# Patient Record
Sex: Female | Born: 1950 | Race: Black or African American | Hispanic: No | Marital: Single | Smoking: Former smoker
Health system: Southern US, Community
[De-identification: ages and names within clinical notes are randomized; demographics above are authoritative.]

## PROBLEM LIST (undated history)

## (undated) DIAGNOSIS — N200 Calculus of kidney: Secondary | ICD-10-CM

## (undated) DIAGNOSIS — R945 Abnormal results of liver function studies: Secondary | ICD-10-CM

## (undated) DIAGNOSIS — K8020 Calculus of gallbladder without cholecystitis without obstruction: Secondary | ICD-10-CM

## (undated) DIAGNOSIS — K602 Anal fissure, unspecified: Secondary | ICD-10-CM

## (undated) DIAGNOSIS — R252 Cramp and spasm: Secondary | ICD-10-CM

## (undated) DIAGNOSIS — M549 Dorsalgia, unspecified: Secondary | ICD-10-CM

## (undated) DIAGNOSIS — M810 Age-related osteoporosis without current pathological fracture: Secondary | ICD-10-CM

## (undated) DIAGNOSIS — D649 Anemia, unspecified: Secondary | ICD-10-CM

## (undated) DIAGNOSIS — K589 Irritable bowel syndrome without diarrhea: Secondary | ICD-10-CM

## (undated) DIAGNOSIS — T7840XA Allergy, unspecified, initial encounter: Secondary | ICD-10-CM

## (undated) DIAGNOSIS — IMO0002 Reserved for concepts with insufficient information to code with codable children: Secondary | ICD-10-CM

## (undated) DIAGNOSIS — F411 Generalized anxiety disorder: Secondary | ICD-10-CM

## (undated) DIAGNOSIS — Z87442 Personal history of urinary calculi: Secondary | ICD-10-CM

## (undated) DIAGNOSIS — M1990 Unspecified osteoarthritis, unspecified site: Secondary | ICD-10-CM

## (undated) DIAGNOSIS — K219 Gastro-esophageal reflux disease without esophagitis: Secondary | ICD-10-CM

## (undated) DIAGNOSIS — E876 Hypokalemia: Secondary | ICD-10-CM

## (undated) DIAGNOSIS — R319 Hematuria, unspecified: Secondary | ICD-10-CM

## (undated) DIAGNOSIS — I10 Essential (primary) hypertension: Secondary | ICD-10-CM

## (undated) DIAGNOSIS — D126 Benign neoplasm of colon, unspecified: Secondary | ICD-10-CM

## (undated) DIAGNOSIS — R109 Unspecified abdominal pain: Secondary | ICD-10-CM

## (undated) DIAGNOSIS — J309 Allergic rhinitis, unspecified: Secondary | ICD-10-CM

## (undated) DIAGNOSIS — Z86010 Personal history of colonic polyps: Secondary | ICD-10-CM

## (undated) DIAGNOSIS — Z0000 Encounter for general adult medical examination without abnormal findings: Secondary | ICD-10-CM

## (undated) HISTORY — DX: Gastro-esophageal reflux disease without esophagitis: K21.9

## (undated) HISTORY — DX: Calculus of gallbladder without cholecystitis without obstruction: K80.20

## (undated) HISTORY — DX: Generalized anxiety disorder: F41.1

## (undated) HISTORY — DX: Anemia, unspecified: D64.9

## (undated) HISTORY — DX: Essential (primary) hypertension: I10

## (undated) HISTORY — DX: Irritable bowel syndrome without diarrhea: K58.9

## (undated) HISTORY — PX: POLYPECTOMY: SHX149

## (undated) HISTORY — DX: Reserved for concepts with insufficient information to code with codable children: IMO0002

## (undated) HISTORY — DX: Unspecified abdominal pain: R10.9

## (undated) HISTORY — DX: Age-related osteoporosis without current pathological fracture: M81.0

## (undated) HISTORY — DX: Cramp and spasm: R25.2

## (undated) HISTORY — DX: Personal history of urinary calculi: Z87.442

## (undated) HISTORY — DX: Allergy, unspecified, initial encounter: T78.40XA

## (undated) HISTORY — DX: Hypokalemia: E87.6

## (undated) HISTORY — DX: Dorsalgia, unspecified: M54.9

## (undated) HISTORY — DX: Calculus of kidney: N20.0

## (undated) HISTORY — DX: Personal history of colonic polyps: Z86.010

## (undated) HISTORY — PX: COLONOSCOPY: SHX174

## (undated) HISTORY — DX: Benign neoplasm of colon, unspecified: D12.6

## (undated) HISTORY — DX: Abnormal results of liver function studies: R94.5

## (undated) HISTORY — DX: Hematuria, unspecified: R31.9

## (undated) HISTORY — PX: CHOLECYSTECTOMY: SHX55

## (undated) HISTORY — DX: Encounter for general adult medical examination without abnormal findings: Z00.00

## (undated) HISTORY — DX: Allergic rhinitis, unspecified: J30.9

---

## 1983-11-22 HISTORY — PX: OTHER SURGICAL HISTORY: SHX169

## 1985-11-21 HISTORY — PX: OTHER SURGICAL HISTORY: SHX169

## 2005-03-31 ENCOUNTER — Ambulatory Visit: Payer: Self-pay | Admitting: Internal Medicine

## 2005-04-28 ENCOUNTER — Ambulatory Visit: Payer: Self-pay

## 2005-09-19 ENCOUNTER — Ambulatory Visit: Payer: Self-pay | Admitting: Internal Medicine

## 2008-02-08 ENCOUNTER — Encounter: Payer: Self-pay | Admitting: Internal Medicine

## 2008-03-18 ENCOUNTER — Ambulatory Visit: Payer: Self-pay | Admitting: Internal Medicine

## 2008-03-18 DIAGNOSIS — Z860100 Personal history of colon polyps, unspecified: Secondary | ICD-10-CM | POA: Insufficient documentation

## 2008-03-18 DIAGNOSIS — E876 Hypokalemia: Secondary | ICD-10-CM

## 2008-03-18 DIAGNOSIS — K219 Gastro-esophageal reflux disease without esophagitis: Secondary | ICD-10-CM

## 2008-03-18 DIAGNOSIS — J309 Allergic rhinitis, unspecified: Secondary | ICD-10-CM | POA: Insufficient documentation

## 2008-03-18 DIAGNOSIS — F411 Generalized anxiety disorder: Secondary | ICD-10-CM | POA: Insufficient documentation

## 2008-03-18 DIAGNOSIS — R945 Abnormal results of liver function studies: Secondary | ICD-10-CM | POA: Insufficient documentation

## 2008-03-18 DIAGNOSIS — K8020 Calculus of gallbladder without cholecystitis without obstruction: Secondary | ICD-10-CM

## 2008-03-18 DIAGNOSIS — I10 Essential (primary) hypertension: Secondary | ICD-10-CM

## 2008-03-18 HISTORY — DX: Calculus of gallbladder without cholecystitis without obstruction: K80.20

## 2008-03-18 HISTORY — DX: Abnormal results of liver function studies: R94.5

## 2008-03-18 HISTORY — DX: Gastro-esophageal reflux disease without esophagitis: K21.9

## 2008-03-18 HISTORY — DX: Hypokalemia: E87.6

## 2008-03-18 HISTORY — DX: Essential (primary) hypertension: I10

## 2008-03-18 HISTORY — DX: Personal history of colonic polyps: Z86.010

## 2008-03-18 HISTORY — DX: Generalized anxiety disorder: F41.1

## 2008-03-18 HISTORY — DX: Allergic rhinitis, unspecified: J30.9

## 2008-03-19 LAB — CONVERTED CEMR LAB
Alkaline Phosphatase: 88 units/L (ref 39–117)
Bilirubin, Direct: 0.1 mg/dL (ref 0.0–0.3)
Total Bilirubin: 0.6 mg/dL (ref 0.3–1.2)
Total Protein: 7.9 g/dL (ref 6.0–8.3)

## 2008-08-23 ENCOUNTER — Emergency Department (HOSPITAL_BASED_OUTPATIENT_CLINIC_OR_DEPARTMENT_OTHER): Admission: EM | Admit: 2008-08-23 | Discharge: 2008-08-23 | Payer: Self-pay | Admitting: Emergency Medicine

## 2008-09-09 ENCOUNTER — Ambulatory Visit: Payer: Self-pay | Admitting: Internal Medicine

## 2008-09-09 DIAGNOSIS — IMO0002 Reserved for concepts with insufficient information to code with codable children: Secondary | ICD-10-CM

## 2008-09-09 HISTORY — DX: Reserved for concepts with insufficient information to code with codable children: IMO0002

## 2009-01-01 ENCOUNTER — Ambulatory Visit: Payer: Self-pay | Admitting: Internal Medicine

## 2009-01-01 DIAGNOSIS — K589 Irritable bowel syndrome without diarrhea: Secondary | ICD-10-CM

## 2009-01-01 DIAGNOSIS — M549 Dorsalgia, unspecified: Secondary | ICD-10-CM

## 2009-01-01 HISTORY — DX: Irritable bowel syndrome without diarrhea: K58.9

## 2009-01-01 HISTORY — DX: Dorsalgia, unspecified: M54.9

## 2009-01-12 ENCOUNTER — Telehealth (INDEPENDENT_AMBULATORY_CARE_PROVIDER_SITE_OTHER): Payer: Self-pay | Admitting: *Deleted

## 2009-01-26 ENCOUNTER — Encounter: Admission: RE | Admit: 2009-01-26 | Discharge: 2009-01-26 | Payer: Self-pay | Admitting: Internal Medicine

## 2009-01-27 ENCOUNTER — Encounter: Payer: Self-pay | Admitting: Internal Medicine

## 2009-08-03 ENCOUNTER — Telehealth: Payer: Self-pay | Admitting: Internal Medicine

## 2009-10-30 ENCOUNTER — Telehealth: Payer: Self-pay | Admitting: Internal Medicine

## 2009-10-30 ENCOUNTER — Encounter: Payer: Self-pay | Admitting: Internal Medicine

## 2010-01-25 ENCOUNTER — Telehealth (INDEPENDENT_AMBULATORY_CARE_PROVIDER_SITE_OTHER): Payer: Self-pay | Admitting: *Deleted

## 2010-03-01 ENCOUNTER — Ambulatory Visit: Payer: Self-pay | Admitting: Internal Medicine

## 2010-03-01 DIAGNOSIS — R252 Cramp and spasm: Secondary | ICD-10-CM

## 2010-03-01 DIAGNOSIS — R109 Unspecified abdominal pain: Secondary | ICD-10-CM

## 2010-03-01 DIAGNOSIS — Z87442 Personal history of urinary calculi: Secondary | ICD-10-CM

## 2010-03-01 DIAGNOSIS — R319 Hematuria, unspecified: Secondary | ICD-10-CM

## 2010-03-01 HISTORY — DX: Personal history of urinary calculi: Z87.442

## 2010-03-01 HISTORY — DX: Hematuria, unspecified: R31.9

## 2010-03-01 HISTORY — DX: Unspecified abdominal pain: R10.9

## 2010-03-01 HISTORY — DX: Cramp and spasm: R25.2

## 2010-03-01 LAB — CONVERTED CEMR LAB
Alkaline Phosphatase: 74 units/L (ref 39–117)
Basophils Absolute: 0.1 10*3/uL (ref 0.0–0.1)
Basophils Relative: 1.1 % (ref 0.0–3.0)
Bilirubin Urine: NEGATIVE
Creatinine, Ser: 0.7 mg/dL (ref 0.4–1.2)
Eosinophils Absolute: 0.3 10*3/uL (ref 0.0–0.7)
Glucose, Bld: 96 mg/dL (ref 70–99)
Glucose, Urine, Semiquant: NEGATIVE
HCT: 38.5 % (ref 36.0–46.0)
Hemoglobin: 13.1 g/dL (ref 12.0–15.0)
Ketones, ur: NEGATIVE mg/dL
Lipase: 49 units/L (ref 11.0–59.0)
Lymphs Abs: 2.4 10*3/uL (ref 0.7–4.0)
MCV: 85.3 fL (ref 78.0–100.0)
Magnesium: 2.2 mg/dL (ref 1.5–2.5)
Monocytes Absolute: 0.5 10*3/uL (ref 0.1–1.0)
Neutro Abs: 2.4 10*3/uL (ref 1.4–7.7)
Nitrite: NEGATIVE
Platelets: 239 10*3/uL (ref 150.0–400.0)
Protein, U semiquant: NEGATIVE
RBC: 4.52 M/uL (ref 3.87–5.11)
Specific Gravity, Urine: 1.01 (ref 1.000–1.030)
Total Protein, Urine: 30 mg/dL
Total Protein: 7.9 g/dL (ref 6.0–8.3)
Urine Glucose: NEGATIVE mg/dL
Urobilinogen, UA: 0.2 (ref 0.0–1.0)
WBC: 5.8 10*3/uL (ref 4.5–10.5)
pH: 7 (ref 5.0–8.0)

## 2010-03-02 ENCOUNTER — Telehealth: Payer: Self-pay | Admitting: Internal Medicine

## 2010-03-02 ENCOUNTER — Encounter: Payer: Self-pay | Admitting: Internal Medicine

## 2010-03-02 ENCOUNTER — Ambulatory Visit: Payer: Self-pay | Admitting: Cardiovascular Disease

## 2010-03-02 DIAGNOSIS — N200 Calculus of kidney: Secondary | ICD-10-CM | POA: Insufficient documentation

## 2010-03-02 HISTORY — DX: Calculus of kidney: N20.0

## 2010-03-03 ENCOUNTER — Encounter: Payer: Self-pay | Admitting: Internal Medicine

## 2010-03-05 ENCOUNTER — Encounter: Payer: Self-pay | Admitting: Internal Medicine

## 2010-03-08 ENCOUNTER — Ambulatory Visit (HOSPITAL_COMMUNITY): Admission: RE | Admit: 2010-03-08 | Discharge: 2010-03-08 | Payer: Self-pay | Admitting: Urology

## 2010-03-22 ENCOUNTER — Telehealth (INDEPENDENT_AMBULATORY_CARE_PROVIDER_SITE_OTHER): Payer: Self-pay | Admitting: *Deleted

## 2010-03-23 ENCOUNTER — Encounter: Payer: Self-pay | Admitting: Internal Medicine

## 2010-05-25 ENCOUNTER — Encounter: Payer: Self-pay | Admitting: Internal Medicine

## 2010-12-21 NOTE — Progress Notes (Signed)
Summary: REFERRAL  Phone Note Call from Patient Call back at Home Phone [phone] Call back at [facility] VM ON HM #   Summary of Call: Patient is requesting a call regarding CT scan. She was told after CT that Dr Jonny Ruiz was going to refer her to urology. Please call at cell # above.  Initial call taken by: Lamar Sprinkles, CMA,  March 02, 2010 2:19 PM  Follow-up for Phone Call        refer urology - order done Follow-up by: Corwin Levins MD,  March 02, 2010 2:22 PM

## 2010-12-21 NOTE — Letter (Signed)
[facility]: [facility]   Imported By: Sherian Rein 05/27/2010 13:59:13  _____________________________________________________________________  External Attachment:    Type:   Image     Comment:   External Document

## 2010-12-21 NOTE — Assessment & Plan Note (Signed)
Summary: blood in urine-lb   Vital Signs:  Patient profile:   60 year old female Height:      64.5 inches Weight:      169.75 pounds BMI:     28.79 O2 Sat:      97 % on Room air Temp:     98.6 degrees F oral Pulse rate:   67 / minute BP sitting:   132 / 90  (left arm) Cuff size:   regular  Vitals Entered ByZella Ball Ewing (March 01, 2010 4:42 PM)  O2 Flow:  Room air CC: Blood in urine/RE   CC:  Blood in urine/RE.  History of Present Illness: here with onset x 2 days gross hematuria, assoc with lower mid abd and right sided abd pain that seems to start in the right flank area, but little tenderness;  has had also freq and urgency but not clear if actual dysuria, and no frank fever, chills;'  has had some nausea, but no vomiting and has been trying to increased by mouth fluids at home.  Has hx of renal stone 2009 without compication, and other approx 25 yrs ago. No other bowel habit or GU symptoms.  Overall good complacine with meds, and good tolerance.  Pt denies CP, sob, doe, wheezing, orthopnea, pnd, worsening LE edema, palps, dizziness or syncope   Pt denies new neuro symptoms such as headache, facial or extremity weakness   Also incidently with bilat thigh cramps, usually at night , without unusual excercise, or claudication type pain, or diuretic.    Problems Prior to Update: 1)  Leg Cramps  (ICD-729.82) 2)  Hematuria Unspecified  (ICD-599.70) 3)  Flank Pain, Right  (ICD-789.09) 4)  Nephrolithiasis, Hx of  (ICD-V13.01) 5)  Irritable Bowel Syndrome  (ICD-564.1) 6)  Back Pain  (ICD-724.5) 7)  Lumbar Radiculopathy, Left  (ICD-724.4) 8)  Colonic Polyps, Hx of  (ICD-V12.72) 9)  Gerd  (ICD-530.81) 10)  Allergic Rhinitis  (ICD-477.9) 11)  Anxiety  (ICD-300.00) 12)  Hypertension  (ICD-401.9) 13)  Hypokalemia  (ICD-276.8) 14)  Liver Function Tests, Abnormal  (ICD-794.8) 15)  Cholelithiasis  (ICD-574.20)  Medications Prior to Update: 1)  Benazepril Hcl 20 Mg  Tabs (Benazepril  Hcl) .Marland Kitchen.. 1po Qd 2)  Adult Aspirin Ec Low Strength 81 Mg Tbec (Aspirin) .Marland Kitchen.. 1 By Mouth Once Daily 3)  Hydrocodone-Acetaminophen 5-325 Mg Tabs (Hydrocodone-Acetaminophen) .Marland Kitchen.. 1 By Mouth Q 6 Hrs As Needed Pain 4)  Diclofenac Sodium 75 Mg Tbec (Diclofenac Sodium) .Marland Kitchen.. 1 By Mouth Two Times A Day As Needed Pain  Current Medications (verified): 1)  Benazepril Hcl 20 Mg  Tabs (Benazepril Hcl) .Marland Kitchen.. 1po Qd 2)  Adult Aspirin Ec Low Strength 81 Mg Tbec (Aspirin) .Marland Kitchen.. 1 By Mouth Once Daily 3)  Hydrocodone-Acetaminophen 5-325 Mg Tabs (Hydrocodone-Acetaminophen) .Marland Kitchen.. 1 By Mouth Q 6 Hrs As Needed Pain 4)  Diclofenac Sodium 75 Mg Tbec (Diclofenac Sodium) .Marland Kitchen.. 1 By Mouth Two Times A Day As Needed Pain 5)  Ciprofloxacin Hcl 500 Mg Tabs (Ciprofloxacin Hcl) .Marland Kitchen.. 1po Two Times A Day  Allergies (verified): No Known Drug Allergies  Past History:  Past Surgical History: Last updated: 03/18/2008 Cholecystectomy cyst removed from left breast/1987 fibroid tumor 1985  Family History: Last updated: 03/18/2008 arthritis breast cancer lung cancer heart disease HTN brother with DM  Social History: Last updated: 03/18/2008 Former Smoker Alcohol use-yes  Risk Factors: Smoking Status: quit (03/18/2008)  Past Medical History: Cholelithiasis Hypertension Anxiety IBS Allergic rhinitis GERD Colonic polyps, hx of IBS Nephrolithiasis,  hx of - 2009, and 1985  Review of Systems       all otherwise negative per pt -    Physical Exam  General:  alert and overweight-appearing.   Head:  normocephalic and atraumatic.   Eyes:  vision grossly intact, pupils equal, and pupils round.   Ears:  R ear normal and L ear normal.   Nose:  no external deformity and no nasal discharge.   Mouth:  no gingival abnormalities and pharynx pink and moist.   Neck:  supple and no masses.   Lungs:  normal respiratory effort and normal breath sounds.   Heart:  normal rate and regular rhythm.   Abdomen:  soft and normal  bowel sounds with low mid abd and right tender, mild without guarding or rebound Msk:  no joint tenderness and no joint swelling.   Extremities:  no edema, no erythema    Impression & Recommendations:  Problem # 1:  FLANK PAIN, RIGHT (ICD-789.09)  Her updated medication list for this problem includes:    Adult Aspirin Ec Low Strength 81 Mg Tbec (Aspirin) .Marland Kitchen... 1 by mouth once daily    Hydrocodone-acetaminophen 5-325 Mg Tabs (Hydrocodone-acetaminophen) .Marland Kitchen... 1 by mouth q 6 hrs as needed pain    Diclofenac Sodium 75 Mg Tbec (Diclofenac sodium) .Marland Kitchen... 1 by mouth two times a day as needed pain for CT after labs tonight, prob renal stone, need to r/o obstruction  Orders: Radiology Referral (Radiology)  Problem # 2:  HEMATURIA UNSPECIFIED (ICD-599.70)  Her updated medication list for this problem includes:    Ciprofloxacin Hcl 500 Mg Tabs (Ciprofloxacin hcl) .Marland Kitchen... 1po two times a day prob uti vs stone ; for labs and urine studies, as well as CT above  Orders: T-Culture, Urine ([id number]) Radiology Referral (Radiology) TLB-CBC Platelet - w/Differential (85025-CBCD) TLB-Udip w/ Micro (81001-URINE) TLB-BMP (Basic Metabolic Panel-BMET) (80048-METABOL) TLB-Hepatic/Liver Function Pnl (80076-HEPATIC) TLB-Lipase (83690-LIPASE)  Problem # 3:  LEG CRAMPS (ICD-729.82) for labs above, as well as mg;  to try bcomplex vitiamin OTC as well Orders: TLB-Magnesium (Mg) (83735-MG)  Problem # 4:  HYPERTENSION (ICD-401.9)  Her updated medication list for this problem includes:    Benazepril Hcl 20 Mg Tabs (Benazepril hcl) .Marland Kitchen... 1po qd  BP today: 132/90 Prior BP: 112/80 (01/01/2009) stable overall by hx and exam, ok to continue meds/tx as is   Complete Medication List: 1)  Benazepril Hcl 20 Mg Tabs (Benazepril hcl) .Marland Kitchen.. 1po qd 2)  Adult Aspirin Ec Low Strength 81 Mg Tbec (Aspirin) .Marland Kitchen.. 1 by mouth once daily 3)  Hydrocodone-acetaminophen 5-325 Mg Tabs (Hydrocodone-acetaminophen) .Marland Kitchen.. 1 by mouth  q 6 hrs as needed pain 4)  Diclofenac Sodium 75 Mg Tbec (Diclofenac sodium) .Marland Kitchen.. 1 by mouth two times a day as needed pain 5)  Ciprofloxacin Hcl 500 Mg Tabs (Ciprofloxacin hcl) .Marland Kitchen.. 1po two times a day  Patient Instructions: 1)  Please go to the Lab in the basement for your blood and/or urine tests today  2)  Please take all new medications as prescribed 3)  You will be contacted about the referral(s) to: CT scan for tomorrow 4)  you should try B  complex vitamin OTC for the leg cramps 5)  Please schedule a follow-up appointment in 6 months with CPX labs Prescriptions: CIPROFLOXACIN HCL 500 MG TABS (CIPROFLOXACIN HCL) 1po two times a day  #20 x 0   Entered and Authorized by:   Corwin Levins MD   Signed by:   Corwin Levins MD  on 03/01/2010   Method used:   Print then Give to Patient   RxID:   [id number] HYDROCODONE-ACETAMINOPHEN 5-325 MG TABS (HYDROCODONE-ACETAMINOPHEN) 1 by mouth q 6 hrs as needed pain  #60 x 2   Entered and Authorized by:   Corwin Levins MD   Signed by:   Corwin Levins MD on 03/01/2010   Method used:   Print then Give to Patient   RxID:   [id number]   Laboratory Results   Urine Tests    Routine Urinalysis   Color: yellow Appearance: Clear Glucose: negative   (Normal Range: Negative) Bilirubin: small   (Normal Range: Negative) Ketone: negative   (Normal Range: Negative) Spec. Gravity: 1.010   (Normal Range: 1.003-1.035) Blood: large   (Normal Range: Negative) pH: 5.0   (Normal Range: 5.0-8.0) Protein: negative   (Normal Range: Negative) Urobilinogen: 0.2   (Normal Range: 0-1) Nitrite: negative   (Normal Range: Negative) Leukocyte Esterace: trace   (Normal Range: Negative)

## 2010-12-21 NOTE — Letter (Signed)
[facility]   Imported By: Sherian Rein 03/10/2010 09:05:47  _____________________________________________________________________  External Attachment:    Type:   Image     Comment:   External Document

## 2010-12-21 NOTE — Letter (Signed)
Summary: [facility]   Imported By: Sherian Rein 03/09/2010 08:29:09  _____________________________________________________________________  External Attachment:    Type:   Image     Comment:   External Document

## 2010-12-21 NOTE — Progress Notes (Signed)
Summary: Records request from [facility]  Request for records received from [facility]. Request forwarded to [employer]. Dena Chavis  January 25, 2010 3:26 PM

## 2010-12-21 NOTE — Progress Notes (Signed)
  Request recieved from [facility] sent to [facility]  Mar 22, 2010 9:33 AM

## 2010-12-21 NOTE — Letter (Signed)
Summary: [facility]   Imported By: Sherian Rein 03/29/2010 12:22:17  _____________________________________________________________________  External Attachment:    Type:   Image     Comment:   External Document

## 2010-12-21 NOTE — Miscellaneous (Signed)
Summary: Orders Update  Clinical Lists Changes  Problems: Added new problem of RENAL CALCULUS, RIGHT (ICD-592.0) Orders: Added new Referral order of Urology Referral (Urology) - Signed

## 2011-03-21 ENCOUNTER — Other Ambulatory Visit: Payer: Self-pay | Admitting: Internal Medicine

## 2011-04-03 ENCOUNTER — Encounter: Payer: Self-pay | Admitting: Internal Medicine

## 2011-04-03 DIAGNOSIS — Z0000 Encounter for general adult medical examination without abnormal findings: Secondary | ICD-10-CM

## 2011-04-03 HISTORY — DX: Encounter for general adult medical examination without abnormal findings: Z00.00

## 2011-04-06 ENCOUNTER — Encounter: Payer: Self-pay | Admitting: Internal Medicine

## 2011-04-06 ENCOUNTER — Ambulatory Visit (INDEPENDENT_AMBULATORY_CARE_PROVIDER_SITE_OTHER): Payer: Self-pay | Admitting: Internal Medicine

## 2011-04-06 VITALS — BP 130/82 | HR 69 | Temp 98.6°F | Ht 64.0 in | Wt 164.4 lb

## 2011-04-06 DIAGNOSIS — M25559 Pain in unspecified hip: Secondary | ICD-10-CM

## 2011-04-06 DIAGNOSIS — I10 Essential (primary) hypertension: Secondary | ICD-10-CM

## 2011-04-06 DIAGNOSIS — M25551 Pain in right hip: Secondary | ICD-10-CM

## 2011-04-06 DIAGNOSIS — M25552 Pain in left hip: Secondary | ICD-10-CM

## 2011-04-06 MED ORDER — BENAZEPRIL HCL 20 MG PO TABS: 20.0000 mg | ORAL_TABLET | Freq: Every day | ORAL | Status: DC

## 2011-04-06 MED ORDER — TRAMADOL-ACETAMINOPHEN 37.5-325 MG PO TABS: 1.0000 | ORAL_TABLET | Freq: Four times a day (QID) | ORAL | Status: AC | PRN

## 2011-04-06 NOTE — Patient Instructions (Signed)
Take all new medications as prescribed - the new pain medication Continue all other medications as before Please go to LAB in the Basement for the blood and/or urine tests to be done at your convenience Please call the phone number [phone] (the [employer]Tree System) for results of testing in 2-3 days;  When calling, simply dial the number, and when prompted enter the MRN number above (the Medical Record Number) and the # key, then the message should start. Please return in 1 year for your yearly visit, or sooner if needed

## 2011-04-06 NOTE — Progress Notes (Signed)
Subjective:    Patient ID: Robin Obrien, female    DOB: 25-Feb-1951, 60 y.o.   MRN: [id number]  HPI  Here to f/u; unfortunately no insurance at this time, but has mild hearing loss on the right - ? Wax, and needs med refills.  Bilat pain to the hips and stiffness gradually worse over the past yr, worse to ambulate but no falls;  Cannot afford ortho, and does not want to take the norco very much due to wariness of side effects.  Pt denies chest pain, increased sob or doe, wheezing, orthopnea, PND, increased LE swelling, palpitations, dizziness or syncope.  Pt denies new neurological symptoms such as new headache, or facial or extremity weakness or numbness   Pt denies polydipsia, polyuria.   Pt denies fever, wt loss, night sweats, loss of appetite, or other constitutional symptoms  Overall good compliance with treatment, and good medicine tolerability except for the above.  Denies worsening depressive symptoms, suicidal ideation, or panic, though has ongoing anxiety, not increased recently.  Past Medical History  Diagnosis Date  . HYPOKALEMIA 03/18/2008  . ANXIETY 03/18/2008  . HYPERTENSION 03/18/2008  . ALLERGIC RHINITIS 03/18/2008  . GERD 03/18/2008  . Irritable bowel syndrome 01/01/2009  . CHOLELITHIASIS 03/18/2008  . RENAL CALCULUS, RIGHT 03/02/2010  . HEMATURIA UNSPECIFIED 03/01/2010  . 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 BACK PAIN 01/01/2009  . LEG CRAMPS 03/01/2010  . FLANK PAIN, RIGHT 03/01/2010  . LIVER FUNCTION TESTS, ABNORMAL 03/18/2008  . COLONIC POLYPS, HX OF 03/18/2008  . NEPHROLITHIASIS, HX OF 03/01/2010  . Preventative health care 04/03/2011   Past Surgical History  Procedure Date  . Cholecystectomy   . Cyst removed 1987    removed from left breast  . Fibroid tumor 1985    reports that she has quit smoking. She does not have any smokeless tobacco history on file. She reports that she drinks alcohol. Her drug history not on file. family history includes Arthritis in her other;  Cancer in her other; Diabetes in her brother; Heart disease in her other; and Hypertension in her other. No Known Allergies Current Outpatient Prescriptions on File Prior to Visit  Medication Sig Dispense Refill  . aspirin 81 MG EC tablet Take 81 mg by mouth daily.        Marland Kitchen DISCONTD: benazepril (LOTENSIN) 20 MG tablet TAKE ONE TABLET BY MOUTH EVERY DAY  30 tablet  0  . DISCONTD: HYDROcodone-acetaminophen (NORCO) 5-325 MG per tablet Take 1 tablet by mouth every 6 (six) hours as needed.        Marland Kitchen DISCONTD: ciprofloxacin (CIPRO) 500 MG tablet Take 500 mg by mouth 2 (two) times daily.        Marland Kitchen DISCONTD: diclofenac (VOLTAREN) 75 MG EC tablet Take 75 mg by mouth 2 (two) times daily.         Review of Systems All otherwise neg per pt     Objective:   Physical Exam BP 130/82  Pulse 69  Temp(Src) 98.6 F (37 C) (Oral)  Ht 5\' 4"  (1.626 m)  Wt 164 lb 6 oz (74.56 kg)  BMI 28.21 kg/m2  SpO2 97% Physical Exam  VS noted Constitutional: Pt appears well-developed and well-nourished.  HENT: Head: Normocephalic.  Right Ear: External ear normal.  Left Ear: External ear normal.  Bilat tm's ok after wax irrigated from right ear Eyes: Conjunctivae and EOM are normal. Pupils are equal, round, and reactive to light.  Neck: Normal range of motion. Neck supple.  Cardiovascular: Normal  rate and regular rhythm.   Pulmonary/Chest: Effort normal and breath sounds normal.  Abd:  Soft, NT, non-distended, + BS Neurological: Pt is alert. No cranial nerve deficit.  Skin: Skin is warm. No erythema.  Psychiatric: Pt behavior is normal. Thought content normal.  Bilat hips with moderate pain with ROM testing and decreased ROM bilat        Assessment & Plan:

## 2011-04-06 NOTE — Assessment & Plan Note (Signed)
stable overall by hx and exam, most recent lab reviewed with pt, and pt to continue medical treatment as before, will check labs to include K and renal function

## 2011-04-06 NOTE — Assessment & Plan Note (Signed)
I suspect underlying DJD, declines films due to cost, will tx with ultracet for pain relief (voltaren no help last yr), ok to leave off the norco for now;  I suggested ortho but she declines due to cost as well

## 2011-04-14 ENCOUNTER — Telehealth: Payer: Self-pay

## 2011-04-14 DIAGNOSIS — M25552 Pain in left hip: Secondary | ICD-10-CM

## 2011-04-14 DIAGNOSIS — M25551 Pain in right hip: Secondary | ICD-10-CM

## 2011-04-14 NOTE — Telephone Encounter (Signed)
Pt called stating Tramadol is too expensive. Pt is requesting Rx for cheaper alternative, please advise.

## 2011-04-15 MED ORDER — ACETAMINOPHEN-CODEINE 300-30 MG PO TABS: 1.0000 | ORAL_TABLET | ORAL | Status: DC | PRN

## 2011-04-15 NOTE — Assessment & Plan Note (Signed)
Pt states without drug plan, the ultracet is too expensive  Will try tylenol #3 prn

## 2011-04-15 NOTE — Telephone Encounter (Signed)
Rx faxed to pharmacy, pt informed.  

## 2012-01-20 ENCOUNTER — Other Ambulatory Visit: Payer: Self-pay | Admitting: Internal Medicine

## 2012-01-20 NOTE — Telephone Encounter (Signed)
Done hardcopy to robin  

## 2012-01-20 NOTE — Telephone Encounter (Signed)
Patient would like tylenol with codeine 3 filled as she is going out of the country and does need. Please advise call back number for this patient is [phone]

## 2012-01-20 NOTE — Telephone Encounter (Signed)
Called the patient hardcopy of prescription as been faxed to [facility].

## 2012-02-02 ENCOUNTER — Encounter: Payer: Self-pay | Admitting: Internal Medicine

## 2012-02-02 ENCOUNTER — Ambulatory Visit (INDEPENDENT_AMBULATORY_CARE_PROVIDER_SITE_OTHER): Payer: Self-pay | Admitting: Internal Medicine

## 2012-02-02 VITALS — BP 100/68 | HR 72 | Temp 97.0°F | Ht 64.5 in | Wt 158.5 lb

## 2012-02-02 DIAGNOSIS — R197 Diarrhea, unspecified: Secondary | ICD-10-CM

## 2012-02-02 MED ORDER — CIPROFLOXACIN HCL 500 MG PO TABS: 500.0000 mg | ORAL_TABLET | Freq: Two times a day (BID) | ORAL | Status: AC

## 2012-02-02 NOTE — Patient Instructions (Signed)
Take all new medications as prescribed Continue all other medications as before Please have the pharmacy call with any refills you may need. Please contact the [facility] office for asssistance there

## 2012-02-05 ENCOUNTER — Encounter: Payer: Self-pay | Admitting: Internal Medicine

## 2012-02-05 NOTE — Progress Notes (Signed)
  Subjective:    Patient ID: Robin Obrien, female    DOB: 16-May-1951, 61 y.o.   MRN: [id number]  HPI  Here with c/o 4-5 days onset illness with ? Low grade temp, nausea, crampy abd pains, general weakness and malaise, diffuse myalgias, and watery loose stools/diarrhea without vomiting, chills, prostration, or blood.  Just back from a cruise to [place], where she is not absolutely sure she only drank bottled beverages. Went with son who earned the cruise for 2 at work.  No other complaints Past Medical History  Diagnosis Date  . HYPOKALEMIA 03/18/2008  . ANXIETY 03/18/2008  . HYPERTENSION 03/18/2008  . ALLERGIC RHINITIS 03/18/2008  . GERD 03/18/2008  . Irritable bowel syndrome 01/01/2009  . CHOLELITHIASIS 03/18/2008  . RENAL CALCULUS, RIGHT 03/02/2010  . HEMATURIA UNSPECIFIED 03/01/2010  . 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 BACK PAIN 01/01/2009  . LEG CRAMPS 03/01/2010  . FLANK PAIN, RIGHT 03/01/2010  . LIVER FUNCTION TESTS, ABNORMAL 03/18/2008  . COLONIC POLYPS, HX OF 03/18/2008  . NEPHROLITHIASIS, HX OF 03/01/2010  . Preventative health care 04/03/2011   Past Surgical History  Procedure Date  . Cholecystectomy   . Cyst removed 1987    removed from left breast  . Fibroid tumor 1985    reports that she has quit smoking. She does not have any smokeless tobacco history on file. She reports that she drinks alcohol. Her drug history not on file. family history includes Arthritis in her other; Cancer in her other; Diabetes in her brother; Heart disease in her other; and Hypertension in her other. No Known Allergies Current Outpatient Prescriptions on File Prior to Visit  Medication Sig Dispense Refill  . acetaminophen-codeine (TYLENOL #3) 300-30 MG per tablet TAKE ONE TABLET BY MOUTH EVERY 4 HOURS AS NEEDED FOR PAIN  40 tablet  0  . aspirin 81 MG EC tablet Take 81 mg by mouth daily.        . benazepril (LOTENSIN) 20 MG tablet Take 1 tablet (20 mg total) by mouth daily.  90 tablet  3   Review of  Systems All otherwise neg per pt     Objective:   Physical Exam BP 100/68  Pulse 72  Temp(Src) 97 F (36.1 C) (Oral)  Ht 5' 4.5" (1.638 m)  Wt 158 lb 8 oz (71.895 kg)  BMI 26.79 kg/m2  SpO2 96% Physical Exam  VS noted, fatigued appearing but easily gets up to exam table Constitutional: Pt appears well-developed and well-nourished.  HENT: Head: Normocephalic.  Right Ear: External ear normal.  Left Ear: External ear normal.  Eyes: Conjunctivae and EOM are normal. Pupils are equal, round, and reactive to light.  Neck: Normal range of motion. Neck supple.  Cardiovascular: Normal rate and regular rhythm.   Pulmonary/Chest: Effort normal and breath sounds normal.  Abd:  Soft, NT, non-distended, + BS - benign exam Neurological: Pt is alert. No cranial nerve deficit.  Skin: Skin is warm. No erythema.  Psychiatric: Pt behavior is normal. Thought content normal.     Assessment & Plan:

## 2012-02-05 NOTE — Assessment & Plan Note (Signed)
Likely infectious illness I think maybe related to local noroviral outbreak, or perhaps traveler's diarrhea with recent travel;  For cipro course with instructions that ok to hold the cipro after perhaps 3-5 days if the diarrhea improved,  to f/u any worsening symptoms or concerns

## 2012-04-13 ENCOUNTER — Other Ambulatory Visit: Payer: Self-pay | Admitting: Internal Medicine

## 2013-04-12 ENCOUNTER — Ambulatory Visit: Payer: Self-pay | Admitting: Internal Medicine

## 2013-04-12 ENCOUNTER — Ambulatory Visit: Payer: Self-pay

## 2013-04-12 ENCOUNTER — Encounter: Payer: Self-pay | Admitting: Internal Medicine

## 2013-04-12 ENCOUNTER — Ambulatory Visit (INDEPENDENT_AMBULATORY_CARE_PROVIDER_SITE_OTHER): Payer: Self-pay | Admitting: Internal Medicine

## 2013-04-12 VITALS — BP 122/88 | HR 70 | Temp 97.9°F | Ht 64.5 in | Wt 164.0 lb

## 2013-04-12 DIAGNOSIS — R5381 Other malaise: Secondary | ICD-10-CM | POA: Insufficient documentation

## 2013-04-12 DIAGNOSIS — J309 Allergic rhinitis, unspecified: Secondary | ICD-10-CM

## 2013-04-12 DIAGNOSIS — R5383 Other fatigue: Secondary | ICD-10-CM

## 2013-04-12 DIAGNOSIS — J069 Acute upper respiratory infection, unspecified: Secondary | ICD-10-CM | POA: Insufficient documentation

## 2013-04-12 DIAGNOSIS — I10 Essential (primary) hypertension: Secondary | ICD-10-CM

## 2013-04-12 LAB — URINALYSIS, ROUTINE W REFLEX MICROSCOPIC
Bilirubin Urine: NEGATIVE
Ketones, ur: NEGATIVE
Specific Gravity, Urine: 1.025 (ref 1.000–1.030)
Total Protein, Urine: NEGATIVE
Urine Glucose: NEGATIVE
Urobilinogen, UA: 0.2 (ref 0.0–1.0)

## 2013-04-12 LAB — CBC WITH DIFFERENTIAL/PLATELET
Basophils Absolute: 0.1 10*3/uL (ref 0.0–0.1)
MCH: 28.1 pg (ref 26.0–34.0)
MCV: 80.2 fL (ref 78.0–100.0)
Monocytes Absolute: 0.4 10*3/uL (ref 0.1–1.0)
Monocytes Relative: 8 % (ref 3–12)
Neutro Abs: 2.5 10*3/uL (ref 1.7–7.7)
Platelets: 271 10*3/uL (ref 150–400)
WBC: 5.5 10*3/uL (ref 4.0–10.5)

## 2013-04-12 LAB — HEPATIC FUNCTION PANEL
ALT: 19 U/L (ref 0–35)
AST: 21 U/L (ref 0–37)
Total Bilirubin: 0.4 mg/dL (ref 0.3–1.2)
Total Protein: 7.9 g/dL (ref 6.0–8.3)

## 2013-04-12 LAB — BASIC METABOLIC PANEL
BUN: 15 mg/dL (ref 6–23)
CO2: 27 mEq/L (ref 19–32)
Calcium: 9.4 mg/dL (ref 8.4–10.5)
Sodium: 141 mEq/L (ref 135–145)

## 2013-04-12 MED ORDER — PREDNISONE 10 MG PO TABS: ORAL_TABLET | ORAL | Status: DC

## 2013-04-12 MED ORDER — LEVOFLOXACIN 250 MG PO TABS: 250.0000 mg | ORAL_TABLET | Freq: Every day | ORAL | Status: DC

## 2013-04-12 NOTE — Assessment & Plan Note (Signed)
With seasonal flare, for predpack asd,  to f/u any worsening symptoms or concerns

## 2013-04-12 NOTE — Progress Notes (Signed)
Subjective:    Patient ID: Robin Obrien, female    DOB: 29-May-1951, 62 y.o.   MRN: [id number]  HPI  Here with 2-3 days acute onset fever, facial pain, pressure, headache, general weakness and malaise, and greenish d/c, with mild ST and cough, but pt denies chest pain, wheezing, increased sob or doe, orthopnea, PND, increased LE swelling, palpitations, dizziness or syncope. Does have several wks ongoing nasal allergy symptoms with clearish congestion, itch and sneezing, without fever, pain, ST, cough, swelling or wheezing.  Does c/o ongoing fatigue, but denies signficant daytime hypersomnolence, just feels exhausted Past Medical History  Diagnosis Date  . HYPOKALEMIA 03/18/2008  . ANXIETY 03/18/2008  . HYPERTENSION 03/18/2008  . ALLERGIC RHINITIS 03/18/2008  . GERD 03/18/2008  . Irritable bowel syndrome 01/01/2009  . CHOLELITHIASIS 03/18/2008  . RENAL CALCULUS, RIGHT 03/02/2010  . HEMATURIA UNSPECIFIED 03/01/2010  . 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 BACK PAIN 01/01/2009  . LEG CRAMPS 03/01/2010  . FLANK PAIN, RIGHT 03/01/2010  . LIVER FUNCTION TESTS, ABNORMAL 03/18/2008  . COLONIC POLYPS, HX OF 03/18/2008  . NEPHROLITHIASIS, HX OF 03/01/2010  . Preventative health care 04/03/2011   Past Surgical History  Procedure Laterality Date  . Cholecystectomy    . Cyst removed  1987    removed from left breast  . Fibroid tumor  1985    reports that she has quit smoking. She does not have any smokeless tobacco history on file. She reports that  drinks alcohol. Her drug history is not on file. family history includes Arthritis in her other; Cancer in her other; Diabetes in her brother; Heart disease in her other; and Hypertension in her other. No Known Allergies Current Outpatient Prescriptions on File Prior to Visit  Medication Sig Dispense Refill  . aspirin 81 MG EC tablet Take 81 mg by mouth daily.        . benazepril (LOTENSIN) 20 MG tablet TAKE ONE TABLET BY MOUTH EVERY DAY  90 tablet  3  .  acetaminophen-codeine (TYLENOL #3) 300-30 MG per tablet TAKE ONE TABLET BY MOUTH EVERY 4 HOURS AS NEEDED FOR PAIN  40 tablet  0  . benazepril (LOTENSIN) 20 MG tablet TAKE ONE TABLET BY MOUTH EVERY DAY  90 tablet  3   No current facility-administered medications on file prior to visit.   Review of Systems  Constitutional: Negative for unexpected weight change, or unusual diaphoresis  HENT: Negative for tinnitus.   Eyes: Negative for photophobia and visual disturbance.  Respiratory: Negative for choking and stridor.   Gastrointestinal: Negative for vomiting and blood in stool.  Genitourinary: Negative for hematuria and decreased urine volume.  Musculoskeletal: Negative for acute joint swelling Skin: Negative for color change and wound.  Neurological: Negative for tremors and numbness other than noted  Psychiatric/Behavioral: Negative for decreased concentration or  hyperactivity.       Objective:   Physical Exam BP 122/88  Pulse 70  Temp(Src) 97.9 F (36.6 C) (Oral)  Ht 5' 4.5" (1.638 m)  Wt 164 lb (74.39 kg)  BMI 27.73 kg/m2  SpO2 99% VS noted, mild ill Constitutional: Pt appears well-developed and well-nourished.  HENT: Head: NCAT.  Right Ear: External ear normal.  Left Ear: External ear normal.  Bilat tm's with mild erythema.  Max sinus areas mild tender.  Pharynx with mild erythema, no exudate Eyes: Conjunctivae and EOM are normal. Pupils are equal, round, and reactive to light.  Neck: Normal range of motion. Neck supple.  Cardiovascular: Normal rate and regular  rhythm.   Pulmonary/Chest: Effort normal and breath sounds normal.  Abd: soft, NT, + BS Neurological: Pt is alert. Not confused  Skin: Skin is warm. No erythema. No rash No synovitis Psychiatric: Pt behavior is normal. Thought content normal.     Assessment & Plan:

## 2013-04-12 NOTE — Assessment & Plan Note (Signed)
Etiology unclear, Exam otherwise benign, to check labs as documented, follow with expectant management  

## 2013-04-12 NOTE — Assessment & Plan Note (Signed)
Mild to mod, for antibx course,  to f/u any worsening symptoms or concerns 

## 2013-04-12 NOTE — Assessment & Plan Note (Signed)
stable overall by history and exam, recent data reviewed with pt, and pt to continue medical treatment as before,  to f/u any worsening symptoms or concerns BP Readings from Last 3 Encounters:  04/12/13 122/88  02/02/12 100/68  04/06/11 130/82

## 2013-04-12 NOTE — Patient Instructions (Signed)
Please take all new medication as prescribed - the antibiotic, and the prednisone Please continue all other medications as before You can also use Mucinex OTC for congestion, and Zyrtec OTC for allergies Please go to the LAB in the Basement (turn left off the elevator) for the tests to be done today You will be contacted by phone if any changes need to be made immediately.  Otherwise, you will receive a letter about your results with an explanation  Please remember to sign up for My Chart if you have not done so, as this will be important to you in the future with finding out test results, communicating by private email, and scheduling acute appointments online when needed.

## 2013-04-16 ENCOUNTER — Encounter: Payer: Self-pay | Admitting: Internal Medicine

## 2013-04-17 ENCOUNTER — Telehealth: Payer: Self-pay | Admitting: Internal Medicine

## 2013-04-17 MED ORDER — CEPHALEXIN 500 MG PO CAPS: 500.0000 mg | ORAL_CAPSULE | Freq: Four times a day (QID) | ORAL | Status: DC

## 2013-04-17 NOTE — Telephone Encounter (Signed)
Pt called req a call from the nurse concern about the lab test that was done 04/12/13. Inform pt that the letter was sent to the pt 04/16/13, but pt still wants to talk to the nurse. Please call pt.

## 2013-04-17 NOTE — Telephone Encounter (Signed)
Ok for change to cephalexin - done erx

## 2013-04-17 NOTE — Telephone Encounter (Signed)
Patient informed. 

## 2013-04-17 NOTE — Telephone Encounter (Signed)
The patient could only afford #5 of the antibiotic sent in.  She is still having some congestion and would like less expensive alternative sent in pleae

## 2013-04-25 ENCOUNTER — Telehealth: Payer: Self-pay

## 2013-04-25 NOTE — Telephone Encounter (Signed)
Patient requesting probiotics, antibiotics for upset stomach.  Call back number [phone]

## 2013-04-25 NOTE — Telephone Encounter (Signed)
Called the patient informed of MD's instruction

## 2013-04-25 NOTE — Telephone Encounter (Signed)
Ok to try [employer] for probiotic (antibiotic not needed)

## 2013-04-26 ENCOUNTER — Ambulatory Visit: Payer: Self-pay | Admitting: Family

## 2013-06-12 ENCOUNTER — Other Ambulatory Visit: Payer: Self-pay | Admitting: Internal Medicine

## 2013-08-08 ENCOUNTER — Encounter: Payer: Self-pay | Admitting: Internal Medicine

## 2013-08-08 ENCOUNTER — Other Ambulatory Visit (INDEPENDENT_AMBULATORY_CARE_PROVIDER_SITE_OTHER): Payer: Self-pay

## 2013-08-08 ENCOUNTER — Ambulatory Visit (INDEPENDENT_AMBULATORY_CARE_PROVIDER_SITE_OTHER): Payer: Self-pay | Admitting: Internal Medicine

## 2013-08-08 VITALS — BP 130/80 | HR 67 | Temp 98.0°F | Ht 64.5 in | Wt 165.8 lb

## 2013-08-08 DIAGNOSIS — R209 Unspecified disturbances of skin sensation: Secondary | ICD-10-CM

## 2013-08-08 DIAGNOSIS — R252 Cramp and spasm: Secondary | ICD-10-CM

## 2013-08-08 DIAGNOSIS — R202 Paresthesia of skin: Secondary | ICD-10-CM

## 2013-08-08 LAB — T4, FREE: Free T4: 0.78 ng/dL (ref 0.60–1.60)

## 2013-08-08 NOTE — Progress Notes (Signed)
  Subjective:    Patient ID: Robin Obrien, female    DOB: Oct 18, 1951, 62 y.o.   MRN: [id number]  HPI  Here with 3-4 wks tingling particualarly to the hands first thing in the AM, but also this AM with tingling to the feet as well, no weakness or pain per se, except for isolated right upper paracervical shapr pain occas and has been doing twisting excercies in the chair with the group at church.  TSH normal may 2014, pt wants free t4 done as well, needs b12 , and mg since has recent muscle cramps as well.   Past Medical History  Diagnosis Date  . HYPOKALEMIA 03/18/2008  . ANXIETY 03/18/2008  . HYPERTENSION 03/18/2008  . ALLERGIC RHINITIS 03/18/2008  . GERD 03/18/2008  . Irritable bowel syndrome 01/01/2009  . CHOLELITHIASIS 03/18/2008  . RENAL CALCULUS, RIGHT 03/02/2010  . HEMATURIA UNSPECIFIED 03/01/2010  . 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 BACK PAIN 01/01/2009  . LEG CRAMPS 03/01/2010  . FLANK PAIN, RIGHT 03/01/2010  . LIVER FUNCTION TESTS, ABNORMAL 03/18/2008  . COLONIC POLYPS, HX OF 03/18/2008  . NEPHROLITHIASIS, HX OF 03/01/2010  . Preventative health care 04/03/2011   Past Surgical History  Procedure Laterality Date  . Cholecystectomy    . Cyst removed  1987    removed from left breast  . Fibroid tumor  1985    reports that she has quit smoking. She does not have any smokeless tobacco history on file. She reports that  drinks alcohol. Her drug history is not on file. family history includes Arthritis in her other; Cancer in her other; Diabetes in her brother; Heart disease in her other; Hypertension in her other. No Known Allergies Current Outpatient Prescriptions on File Prior to Visit  Medication Sig Dispense Refill  . acetaminophen-codeine (TYLENOL #3) 300-30 MG per tablet TAKE ONE TABLET BY MOUTH EVERY 4 HOURS AS NEEDED FOR PAIN  40 tablet  0  . aspirin 81 MG EC tablet Take 81 mg by mouth daily.        . benazepril (LOTENSIN) 20 MG tablet TAKE ONE TABLET BY MOUTH EVERY DAY  90  tablet  3   No current facility-administered medications on file prior to visit.   Review of Systems All otherwise neg per pt     Objective:   Physical Exam BP 130/80  Pulse 67  Temp(Src) 98 F (36.7 C) (Oral)  Ht 5' 4.5" (1.638 m)  Wt 165 lb 12 oz (75.184 kg)  BMI 28.02 kg/m2  SpO2 99% VS noted,  Constitutional: Pt appears well-developed and well-nourished.  HENT: Head: NCAT.  Right Ear: External ear normal.  Left Ear: External ear normal.  Eyes: Conjunctivae and EOM are normal. Pupils are equal, round, and reactive to light.  Neck: Normal range of motion. Neck supple.  Cardiovascular: Normal rate and regular rhythm.   Pulmonary/Chest: Effort normal and breath sounds normal.  Abd:  Soft, NT, non-distended, + BS Neurological: Pt is alert. Not confused , motor 5/5, sens/dtr intact througout Skin: Skin is warm. No erythema.  Psychiatric: Pt behavior is normal. Thought content normal.     Assessment & Plan:

## 2013-08-08 NOTE — Assessment & Plan Note (Signed)
?   cts related in the UE's but unclear to LE's; for wrist splints at night, and check B12, free t4

## 2013-08-08 NOTE — Patient Instructions (Signed)
Please remember to followup with your GYN for the yearly pap smear and/or mammogram Please consider using the Wrist Splint (from the drug store) to both wrists at night only, as this may help possible carpal tunnel syndrome Please go to the LAB in the Basement (turn left off the elevator) for the tests to be done today - just the Free t4, B12, and Mg levels Please continue all other medications as before, and refills have been done if requested. Please have the pharmacy call with any other refills you may need.  Please remember to sign up for My Chart if you have not done so, as this will be important to you in the future with finding out test results, communicating by private email, and scheduling acute appointments online when needed.  Please return in 6 months, or sooner if needed

## 2013-08-08 NOTE — Assessment & Plan Note (Signed)
Ok for mg today

## 2014-01-21 ENCOUNTER — Telehealth: Payer: Self-pay | Admitting: Internal Medicine

## 2014-01-21 NOTE — Telephone Encounter (Signed)
Ok with me if ok with Dr Etter Sjogren

## 2014-01-21 NOTE — Telephone Encounter (Signed)
Patient is requesting to switch PCP from Dr. Jenny Reichmann to Dr. Etter Sjogren due to her living in [facility]. Is this okay?

## 2014-01-21 NOTE — Telephone Encounter (Signed)
Fine with me

## 2014-02-11 ENCOUNTER — Other Ambulatory Visit: Payer: Self-pay | Admitting: Internal Medicine

## 2014-02-24 ENCOUNTER — Telehealth: Payer: Self-pay

## 2014-02-24 NOTE — Telephone Encounter (Signed)
Medication and allergies:  Reviewed and updated  90 day supply/mail order: n/a Local pharmacy:  [facility] [place], [place].   Immunizations due:  Influenza-declined, Td-OVERDUE, PNA-DUE, Zoster-DUE   A/P: No changes to personal, family history or past surgical hx PAP- last Pap was approximately 8 years ago; normal per patient CCS- 11/21/02-normal; OVERDUE-patient would like to have one scheduled. MMG- last MMG was approximately 5 years ago; benign findings per patient; OVERDUE- patient would like to have one scheduled.   Flu-declined Tdap- greater than 10 years ago per patient PNA- has never had one Shingles- has never had one  To Discuss with Provider: Lower back, hip, and leg pain-would like an orthopedic referral; patient was informed that this concern may or may not be addressed during this visit and that another appointment may need to be scheduled following her establish care appointment.  Patient stated understanding.

## 2014-02-25 ENCOUNTER — Encounter: Payer: Self-pay | Admitting: Family Medicine

## 2014-02-25 ENCOUNTER — Ambulatory Visit (INDEPENDENT_AMBULATORY_CARE_PROVIDER_SITE_OTHER): Payer: Medicare Other | Admitting: Family Medicine

## 2014-02-25 VITALS — BP 122/76 | HR 76 | Temp 98.6°F | Ht 64.5 in | Wt 166.4 lb

## 2014-02-25 DIAGNOSIS — M549 Dorsalgia, unspecified: Secondary | ICD-10-CM | POA: Diagnosis not present

## 2014-02-25 DIAGNOSIS — Z1231 Encounter for screening mammogram for malignant neoplasm of breast: Secondary | ICD-10-CM

## 2014-02-25 DIAGNOSIS — E2839 Other primary ovarian failure: Secondary | ICD-10-CM

## 2014-02-25 MED ORDER — MELOXICAM 15 MG PO TABS: 15.0000 mg | ORAL_TABLET | Freq: Every day | ORAL | Status: DC

## 2014-02-25 NOTE — Progress Notes (Signed)
Pre visit review using our clinic review tool, if applicable. No additional management support is needed unless otherwise documented below in the visit note. 

## 2014-02-25 NOTE — Patient Instructions (Signed)
Back Pain, Adult Low back pain is very common. About 1 in 5 people have back pain.The cause of low back pain is rarely dangerous. The pain often gets better over time.About half of people with a sudden onset of back pain feel better in just 2 weeks. About 8 in 10 people feel better by 6 weeks.  CAUSES Some common causes of back pain include:  Strain of the muscles or ligaments supporting the spine.  Wear and tear (degeneration) of the spinal discs.  Arthritis.  Direct injury to the back. DIAGNOSIS Most of the time, the direct cause of low back pain is not known.However, back pain can be treated effectively even when the exact cause of the pain is unknown.Answering your caregiver's questions about your overall health and symptoms is one of the most accurate ways to make sure the cause of your pain is not dangerous. If your caregiver needs more information, he or she may order lab work or imaging tests (X-rays or MRIs).However, even if imaging tests show changes in your back, this usually does not require surgery. HOME CARE INSTRUCTIONS For many people, back pain returns.Since low back pain is rarely dangerous, it is often a condition that people can learn to manageon their own.   Remain active. It is stressful on the back to sit or stand in one place. Do not sit, drive, or stand in one place for more than 30 minutes at a time. Take short walks on level surfaces as soon as pain allows.Try to increase the length of time you walk each day.  Do not stay in bed.Resting more than 1 or 2 days can delay your recovery.  Do not avoid exercise or work.Your body is made to move.It is not dangerous to be active, even though your back may hurt.Your back will likely heal faster if you return to being active before your pain is gone.  Pay attention to your body when you bend and lift. Many people have less discomfortwhen lifting if they bend their knees, keep the load close to their bodies,and  avoid twisting. Often, the most comfortable positions are those that put less stress on your recovering back.  Find a comfortable position to sleep. Use a firm mattress and lie on your side with your knees slightly bent. If you lie on your back, put a pillow under your knees.  Only take over-the-counter or prescription medicines as directed by your caregiver. Over-the-counter medicines to reduce pain and inflammation are often the most helpful.Your caregiver may prescribe muscle relaxant drugs.These medicines help dull your pain so you can more quickly return to your normal activities and healthy exercise.  Put ice on the injured area.  Put ice in a plastic bag.  Place a towel between your skin and the bag.  Leave the ice on for 15-20 minutes, 03-04 times a day for the first 2 to 3 days. After that, ice and heat may be alternated to reduce pain and spasms.  Ask your caregiver about trying back exercises and gentle massage. This may be of some benefit.  Avoid feeling anxious or stressed.Stress increases muscle tension and can worsen back pain.It is important to recognize when you are anxious or stressed and learn ways to manage it.Exercise is a great option. SEEK MEDICAL CARE IF:  You have pain that is not relieved with rest or medicine.  You have pain that does not improve in 1 week.  You have new symptoms.  You are generally not feeling well. SEEK   IMMEDIATE MEDICAL CARE IF:   You have pain that radiates from your back into your legs.  You develop new bowel or bladder control problems.  You have unusual weakness or numbness in your arms or legs.  You develop nausea or vomiting.  You develop abdominal pain.  You feel faint. Document Released: 11/07/2005 Document Revised: 05/08/2012 Document Reviewed: 03/28/2011 Exit[employer] Patient Information 2014 [employer].  

## 2014-02-25 NOTE — Progress Notes (Signed)
Subjective:    Robin Obrien is a 63 y.o. female who presents for evaluation of low back pain. The patient has had previous osteoarthritis of lumbar spine. Symptoms have been present for several years and are gradually worsening.  Onset was related to / precipitated by a fall.--- in [employer]--slipped on shampoo that spilled on floor.  The pain is located in the across the lower back and radiates to the right hip, left hip, right thigh, left thigh. The pain is described as aching and occurs intermittently. She rates her pain as a 8 on a scale of 0-10. Symptoms are exacerbated by sitting, standing and walking and bending.  Symptoms are improved by chiropractic manipulation, heat, NSAIDs and stretching. She has also tried nothing which provided no symptom relief. She has no other symptoms associated with the back pain. The patient has no "red flag" history indicative of complicated back pain.  The following portions of the patient's history were reviewed and updated as appropriate:  She  has a past medical history of HYPOKALEMIA (03/18/2008); ANXIETY (03/18/2008); HYPERTENSION (03/18/2008); ALLERGIC RHINITIS (03/18/2008); GERD (03/18/2008); Irritable bowel syndrome (01/01/2009); CHOLELITHIASIS (03/18/2008); RENAL CALCULUS, RIGHT (03/02/2010); HEMATURIA UNSPECIFIED (03/01/2010); LUMBAR RADICULOPATHY, LEFT (09/09/2008); BACK PAIN (01/01/2009); LEG CRAMPS (03/01/2010); FLANK PAIN, RIGHT (03/01/2010); LIVER FUNCTION TESTS, ABNORMAL (03/18/2008); COLONIC POLYPS, HX OF (03/18/2008); NEPHROLITHIASIS, HX OF (03/01/2010); and Preventative health care (04/03/2011). She  does not have any pertinent problems on file. She  has past surgical history that includes Cholecystectomy (90's); cyst removed (1987); and fibroid tumor (1985). Her family history includes Arthritis in her other; Breast cancer in her mother; Cancer in her father and other; Diabetes in her brother; Heart disease in her other; Hypertension in her other. She  reports  that she has quit smoking. She does not have any smokeless tobacco history on file. She reports that she drinks alcohol. Her drug history is not on file. She has a current medication list which includes the following prescription(s): aspirin, benazepril, vitamin d3, glucosamine sulfate, naproxen sod-diphenhydramine, and NON FORMULARY. Current Outpatient Prescriptions on File Prior to Visit  Medication Sig Dispense Refill  . aspirin 81 MG EC tablet Take 81 mg by mouth daily.        . benazepril (LOTENSIN) 20 MG tablet TAKE ONE TABLET BY MOUTH EVERY DAY  90 tablet  3  . Cholecalciferol (VITAMIN D3) 1000 UNITS CAPS Take 2,000 Units by mouth daily.      . Glucosamine Sulfate (FP GLUCOSAMINE PO) Take 2,000 mg by mouth daily.      . Naproxen Sod-Diphenhydramine (ALEVE PM PO) Take 1 capsule by mouth as directed.      . NON FORMULARY Take 300 mg by mouth daily. Turmeric       No current facility-administered medications on file prior to visit.   She has No Known Allergies..  Review of Systems Pertinent items are noted in HPI.    Objective:   Inspection and palpation: inspection of back is normal. Muscle tone and ROM exam: limited range of motion with pain, antalgic gait. Straight leg raise: positive at 10 degrees bilaterally. Neurological: DTR = and b/l.    Assessment:    Nonspecific acute low back pain    Plan:    Natural history and expected course discussed. Questions answered. Neurosurgeon distributed. Proper lifting, bending technique discussed. Short (2-4 day) period of relative rest recommended until acute symptoms improve. Ice to affected area as needed for local pain relief. Heat to affected area as needed for local pain  relief. NSAIDs per medication orders. Muscle relaxants per medication orders. Orthopedic referral due to length of problem.

## 2014-03-03 ENCOUNTER — Telehealth: Payer: Self-pay

## 2014-03-03 DIAGNOSIS — Z1231 Encounter for screening mammogram for malignant neoplasm of breast: Secondary | ICD-10-CM

## 2014-03-03 DIAGNOSIS — E2839 Other primary ovarian failure: Secondary | ICD-10-CM

## 2014-03-03 NOTE — Telephone Encounter (Signed)
Orders are in.     KP 

## 2014-03-03 NOTE — Telephone Encounter (Signed)
Message copied by Ewing Schlein on Mon Mar 03, 2014  9:54 AM ------      Message from: Dorian Pod      Created: Fri Feb 28, 2014  4:40 PM      Regarding: mammo and bone density orders       Patient would like to go to [facility] for her mammogram and bone density [facility]. [facility]. Can you put them back in? ------

## 2014-03-04 DIAGNOSIS — M545 Low back pain, unspecified: Secondary | ICD-10-CM | POA: Diagnosis not present

## 2014-03-11 DIAGNOSIS — M545 Low back pain, unspecified: Secondary | ICD-10-CM | POA: Diagnosis not present

## 2014-03-13 DIAGNOSIS — M545 Low back pain, unspecified: Secondary | ICD-10-CM | POA: Diagnosis not present

## 2014-04-16 ENCOUNTER — Telehealth: Payer: Self-pay

## 2014-04-16 NOTE — Telephone Encounter (Signed)
Medication and allergies:  Reviewed and updated  90 day supply/mail order: n/a Local pharmacy:  [facility] [place], [place].  Immunizations due:  Td/tdap-DUE; PNA-declined; Shingles- declined   A/P: No changes to personal, family history or past surgical hx PAP- last PAP 7-8 years ago per patient CCS- 11/21/02--DUE MMG- scheduled, was unable to make it, plans to reschedule in June BD- scheduled, was unable to make it, plans to reschedule in June Flu- did not receive Tdap- DUE  To Discuss with Provider: Nothing at this time.

## 2014-04-17 ENCOUNTER — Ambulatory Visit (INDEPENDENT_AMBULATORY_CARE_PROVIDER_SITE_OTHER): Payer: Medicare Other | Admitting: Family Medicine

## 2014-04-17 ENCOUNTER — Encounter: Payer: Self-pay | Admitting: Family Medicine

## 2014-04-17 VITALS — BP 122/82 | HR 67 | Temp 98.9°F | Ht 64.5 in | Wt 166.2 lb

## 2014-04-17 DIAGNOSIS — Z0000 Encounter for general adult medical examination without abnormal findings: Secondary | ICD-10-CM | POA: Diagnosis not present

## 2014-04-17 DIAGNOSIS — I10 Essential (primary) hypertension: Secondary | ICD-10-CM | POA: Diagnosis not present

## 2014-04-17 DIAGNOSIS — K589 Irritable bowel syndrome without diarrhea: Secondary | ICD-10-CM | POA: Diagnosis not present

## 2014-04-17 NOTE — Patient Instructions (Signed)

## 2014-04-17 NOTE — Progress Notes (Signed)
Subjective:    Robin Obrien is a 63 y.o. female who presents for a welcome to Medicare exam.   Cardiac risk factors: advanced age (older than 71 for men, 59 for women) and hypertension.  Activities of Daily Living  In your present state of health, do you have any difficulty performing the following activities?:  Preparing food and eating?: No Bathing yourself: No Getting dressed: No Using the toilet:No Moving around from place to place: No In the past year have you fallen or had a near fall?:No  Current exercise habits: stretching   Dietary issues discussed: na   Depression Screen (Note: if answer to either of the following is "Yes", then a more complete depression screening is indicated)  Q1: Over the past two weeks, have you felt down, depressed or hopeless?no Q2: Over the past two weeks, have you felt little interest or pleasure in doing things? no   The following portions of the patient's history were reviewed and updated as appropriate:  She  has a past medical history of HYPOKALEMIA (03/18/2008); ANXIETY (03/18/2008); HYPERTENSION (03/18/2008); ALLERGIC RHINITIS (03/18/2008); GERD (03/18/2008); Irritable bowel syndrome (01/01/2009); CHOLELITHIASIS (03/18/2008); RENAL CALCULUS, RIGHT (03/02/2010); HEMATURIA UNSPECIFIED (03/01/2010); LUMBAR RADICULOPATHY, LEFT (09/09/2008); BACK PAIN (01/01/2009); LEG CRAMPS (03/01/2010); FLANK PAIN, RIGHT (03/01/2010); LIVER FUNCTION TESTS, ABNORMAL (03/18/2008); COLONIC POLYPS, HX OF (03/18/2008); NEPHROLITHIASIS, HX OF (03/01/2010); and Preventative health care (04/03/2011). She  does not have any pertinent problems on file. She  has past surgical history that includes Cholecystectomy (90's); cyst removed (1987); and fibroid tumor (1985). Her family history includes Arthritis in her other; Breast cancer in her mother; Cancer in her father and other; Diabetes in her brother; Heart disease in her other; Hypertension in her other. She  reports that she has quit  smoking. She does not have any smokeless tobacco history on file. She reports that she drinks alcohol. Her drug history is not on file. She has a current medication list which includes the following prescription(s): aspirin, benazepril, vitamin d3, glucosamine sulfate, meloxicam, and naproxen sod-diphenhydramine. Current Outpatient Prescriptions on File Prior to Visit  Medication Sig Dispense Refill  . aspirin 81 MG EC tablet Take 81 mg by mouth daily.        . benazepril (LOTENSIN) 20 MG tablet TAKE ONE TABLET BY MOUTH EVERY DAY  90 tablet  3  . Cholecalciferol (VITAMIN D3) 1000 UNITS CAPS Take 2,000 Units by mouth daily.      . Glucosamine Sulfate (FP GLUCOSAMINE PO) Take 2,000 mg by mouth daily.      . meloxicam (MOBIC) 15 MG tablet Take 1 tablet (15 mg total) by mouth daily.  30 tablet  5  . Naproxen Sod-Diphenhydramine (ALEVE PM PO) Take 1 capsule by mouth as directed.       No current facility-administered medications on file prior to visit.   She has No Known Allergies.. Review of Systems  Review of Systems  Constitutional: Negative for activity change, appetite change and fatigue.  HENT: Negative for hearing loss, congestion, tinnitus and ear discharge.   Eyes: Negative for visual disturbance (see optho q1y -- vision corrected to 20/20 with glasses).  Respiratory: Negative for cough, chest tightness and shortness of breath.   Cardiovascular: Negative for chest pain, palpitations and leg swelling.  Gastrointestinal: Negative for abdominal pain, diarrhea, constipation and abdominal distention.  Genitourinary: Negative for urgency, frequency, decreased urine volume and difficulty urinating.  Musculoskeletal: Negative for back pain, arthralgias and gait problem.  Skin: Negative for color change, pallor and rash.  Neurological: Negative for dizziness, light-headedness, numbness and headaches.  Hematological: Negative for adenopathy. Does not bruise/bleed easily.   Psychiatric/Behavioral: Negative for suicidal ideas, confusion, sleep disturbance, self-injury, dysphoric mood, decreased concentration and agitation.  Pt is able to read and write and can do all ADLs No risk for falling No abuse/ violence in home      Objective:     Vision by Snellen chart: right eye:20/30, left eye:20/30 Blood pressure 122/82, pulse 67, temperature 98.9 F (37.2 C), temperature source Oral, height 5' 4.5" (1.638 m), weight 166 lb 3.2 oz (75.388 kg), SpO2 97.00%. Body mass index is 28.1 kg/(m^2). BP 122/82  Pulse 67  Temp(Src) 98.9 F (37.2 C) (Oral)  Ht 5' 4.5" (1.638 m)  Wt 166 lb 3.2 oz (75.388 kg)  BMI 28.10 kg/m2  SpO2 97% General appearance: alert, cooperative, appears stated age and no distress Head: Normocephalic, without obvious abnormality, atraumatic Eyes: conjunctivae/corneas clear. PERRL, EOM's intact. Fundi benign. Ears: normal TM's and external ear canals both ears Nose: Nares normal. Septum midline. Mucosa normal. No drainage or sinus tenderness. Throat: lips, mucosa, and tongue normal; teeth and gums normal Neck: no adenopathy, no carotid bruit, no JVD, supple, symmetrical, trachea midline and thyroid not enlarged, symmetric, no tenderness/mass/nodules Back: symmetric, no curvature. ROM normal. No CVA tenderness. Lungs: clear to auscultation bilaterally Breasts: normal appearance, no masses or tenderness Heart: regular rate and rhythm, S1, S2 normal, no murmur, click, rub or gallop Abdomen: soft, non-tender; bowel sounds normal; no masses,  no organomegaly Pelvic: not indicated; post-menopausal, no abnormal Pap smears in past Extremities: extremities normal, atraumatic, no cyanosis or edema Pulses: 2+ and symmetric Skin: Skin color, texture, turgor normal. No rashes or lesions Lymph nodes: Cervical, supraclavicular, and axillary nodes normal. Neurologic: Alert and oriented X 3, normal strength and tone. Normal symmetric reflexes. Normal  coordination and gait  Psych-no depression, no anxiety    Assessment:     cpe      Plan:     During the course of the visit the patient was educated and counseled about appropriate screening and preventive services including:   Screening electrocardiogram  Screening mammography  Bone densitometry screening  Colorectal cancer screening  Diabetes screening  Advanced directives: has NO advanced directive  - add't info requested. Referral to SW: no  Patient Instructions (the written plan) was given to the patient.   1. Welcome to Medicare preventive visit   - EKG [id number] - Basic metabolic panel; Future - CBC with Differential; Future - Hepatic function panel; Future - Lipid panel; Future - POCT urinalysis dipstick; Future  2. IBS (irritable bowel syndrome)   - Basic metabolic panel; Future - CBC with Differential; Future - Hepatic function panel; Future - Lipid panel; Future - POCT urinalysis dipstick; Future  3. HTN (hypertension) Stable  - Basic metabolic panel; Future - CBC with Differential; Future - Hepatic function panel; Future - Lipid panel; Future - POCT urinalysis dipstick; Future

## 2014-04-17 NOTE — Progress Notes (Signed)
Pre visit review using our clinic review tool, if applicable. No additional management support is needed unless otherwise documented below in the visit note. 

## 2014-04-18 ENCOUNTER — Other Ambulatory Visit (INDEPENDENT_AMBULATORY_CARE_PROVIDER_SITE_OTHER): Payer: Medicare Other

## 2014-04-18 ENCOUNTER — Telehealth: Payer: Self-pay | Admitting: Family Medicine

## 2014-04-18 DIAGNOSIS — I10 Essential (primary) hypertension: Secondary | ICD-10-CM

## 2014-04-18 DIAGNOSIS — K589 Irritable bowel syndrome without diarrhea: Secondary | ICD-10-CM | POA: Diagnosis not present

## 2014-04-18 DIAGNOSIS — Z0000 Encounter for general adult medical examination without abnormal findings: Secondary | ICD-10-CM

## 2014-04-18 LAB — CBC WITH DIFFERENTIAL/PLATELET
Basophils Absolute: 0 10*3/uL (ref 0.0–0.1)
Basophils Relative: 0.7 % (ref 0.0–3.0)
EOS ABS: 0.3 10*3/uL (ref 0.0–0.7)
EOS PCT: 6.8 % — AB (ref 0.0–5.0)
HCT: 37.9 % (ref 36.0–46.0)
Hemoglobin: 12.6 g/dL (ref 12.0–15.0)
LYMPHS ABS: 1.9 10*3/uL (ref 0.7–4.0)
Lymphocytes Relative: 47 % — ABNORMAL HIGH (ref 12.0–46.0)
MCHC: 33.2 g/dL (ref 30.0–36.0)
MCV: 85 fl (ref 78.0–100.0)
Monocytes Absolute: 0.3 10*3/uL (ref 0.1–1.0)
Monocytes Relative: 6.3 % (ref 3.0–12.0)
NEUTROS PCT: 39.2 % — AB (ref 43.0–77.0)
Neutro Abs: 1.6 10*3/uL (ref 1.4–7.7)
PLATELETS: 239 10*3/uL (ref 150.0–400.0)
RBC: 4.46 Mil/uL (ref 3.87–5.11)
RDW: 13.8 % (ref 11.5–15.5)
WBC: 4.1 10*3/uL (ref 4.0–10.5)

## 2014-04-18 LAB — LIPID PANEL
CHOL/HDL RATIO: 4
Cholesterol: 196 mg/dL (ref 0–200)
HDL: 51.7 mg/dL (ref >39.00)
LDL CALC: 126 mg/dL — AB (ref 0–99)
TRIGLYCERIDES: 93 mg/dL (ref 0.0–149.0)
VLDL: 18.6 mg/dL (ref 0.0–40.0)

## 2014-04-18 LAB — BASIC METABOLIC PANEL
BUN: 18 mg/dL (ref 6–23)
CHLORIDE: 107 meq/L (ref 96–112)
CO2: 27 mEq/L (ref 19–32)
CREATININE: 0.6 mg/dL (ref 0.4–1.2)
Calcium: 9.1 mg/dL (ref 8.4–10.5)
GFR: 128.87 mL/min (ref >60.00)
GLUCOSE: 87 mg/dL (ref 70–99)
POTASSIUM: 3.6 meq/L (ref 3.5–5.1)
Sodium: 140 mEq/L (ref 135–145)

## 2014-04-18 LAB — POCT URINALYSIS DIPSTICK
Bilirubin, UA: NEGATIVE
GLUCOSE UA: NEGATIVE
Ketones, UA: NEGATIVE
Leukocytes, UA: NEGATIVE
Nitrite, UA: NEGATIVE
Protein, UA: NEGATIVE
RBC UA: NEGATIVE
SPEC GRAV UA: 1.01
UROBILINOGEN UA: 0.2
pH, UA: 6.5

## 2014-04-18 LAB — HEPATIC FUNCTION PANEL
ALT: 14 U/L (ref 0–35)
AST: 18 U/L (ref 0–37)
Albumin: 4 g/dL (ref 3.5–5.2)
Alkaline Phosphatase: 64 U/L (ref 39–117)
BILIRUBIN DIRECT: 0 mg/dL (ref 0.0–0.3)
Total Bilirubin: 0.3 mg/dL (ref 0.2–1.2)
Total Protein: 7.3 g/dL (ref 6.0–8.3)

## 2014-04-18 NOTE — Telephone Encounter (Signed)
Relevant patient education assigned to patient using Emmi. ° °

## 2014-06-01 ENCOUNTER — Emergency Department (HOSPITAL_BASED_OUTPATIENT_CLINIC_OR_DEPARTMENT_OTHER): Payer: Medicare Other

## 2014-06-01 ENCOUNTER — Encounter (HOSPITAL_BASED_OUTPATIENT_CLINIC_OR_DEPARTMENT_OTHER): Payer: Self-pay | Admitting: Emergency Medicine

## 2014-06-01 ENCOUNTER — Emergency Department (HOSPITAL_BASED_OUTPATIENT_CLINIC_OR_DEPARTMENT_OTHER)
Admission: EM | Admit: 2014-06-01 | Discharge: 2014-06-01 | Disposition: A | Discharge status: Home / Self Care | Payer: Medicare Other | Attending: Emergency Medicine | Admitting: Emergency Medicine

## 2014-06-01 DIAGNOSIS — F411 Generalized anxiety disorder: Secondary | ICD-10-CM | POA: Diagnosis not present

## 2014-06-01 DIAGNOSIS — Z7982 Long term (current) use of aspirin: Secondary | ICD-10-CM | POA: Insufficient documentation

## 2014-06-01 DIAGNOSIS — Z79899 Other long term (current) drug therapy: Secondary | ICD-10-CM | POA: Insufficient documentation

## 2014-06-01 DIAGNOSIS — Z860100 Personal history of colon polyps, unspecified: Secondary | ICD-10-CM | POA: Diagnosis not present

## 2014-06-01 DIAGNOSIS — Z8659 Personal history of other mental and behavioral disorders: Secondary | ICD-10-CM | POA: Diagnosis not present

## 2014-06-01 DIAGNOSIS — K625 Hemorrhage of anus and rectum: Secondary | ICD-10-CM | POA: Diagnosis not present

## 2014-06-01 DIAGNOSIS — R319 Hematuria, unspecified: Secondary | ICD-10-CM | POA: Insufficient documentation

## 2014-06-01 DIAGNOSIS — Z87891 Personal history of nicotine dependence: Secondary | ICD-10-CM | POA: Insufficient documentation

## 2014-06-01 DIAGNOSIS — Z8719 Personal history of other diseases of the digestive system: Secondary | ICD-10-CM | POA: Insufficient documentation

## 2014-06-01 DIAGNOSIS — N390 Urinary tract infection, site not specified: Secondary | ICD-10-CM

## 2014-06-01 DIAGNOSIS — Z87442 Personal history of urinary calculi: Secondary | ICD-10-CM | POA: Diagnosis not present

## 2014-06-01 DIAGNOSIS — IMO0002 Reserved for concepts with insufficient information to code with codable children: Secondary | ICD-10-CM | POA: Diagnosis not present

## 2014-06-01 DIAGNOSIS — I10 Essential (primary) hypertension: Secondary | ICD-10-CM | POA: Diagnosis not present

## 2014-06-01 DIAGNOSIS — Z791 Long term (current) use of non-steroidal anti-inflammatories (NSAID): Secondary | ICD-10-CM | POA: Diagnosis not present

## 2014-06-01 HISTORY — DX: Anal fissure, unspecified: K60.2

## 2014-06-01 LAB — URINALYSIS, ROUTINE W REFLEX MICROSCOPIC
BILIRUBIN URINE: NEGATIVE
Glucose, UA: NEGATIVE mg/dL
Hgb urine dipstick: NEGATIVE
Ketones, ur: NEGATIVE mg/dL
NITRITE: NEGATIVE
PH: 6 (ref 5.0–8.0)
Protein, ur: NEGATIVE mg/dL
Specific Gravity, Urine: 1.031 — ABNORMAL HIGH (ref 1.005–1.030)
Urobilinogen, UA: 0.2 mg/dL (ref 0.0–1.0)

## 2014-06-01 LAB — URINE MICROSCOPIC-ADD ON

## 2014-06-01 LAB — COMPREHENSIVE METABOLIC PANEL
ALBUMIN: 4.4 g/dL (ref 3.5–5.2)
ALK PHOS: 81 U/L (ref 39–117)
ALT: 18 U/L (ref 0–35)
AST: 23 U/L (ref 0–37)
Anion gap: 17 — ABNORMAL HIGH (ref 5–15)
BUN: 26 mg/dL — ABNORMAL HIGH (ref 6–23)
CALCIUM: 9.7 mg/dL (ref 8.4–10.5)
CO2: 23 mEq/L (ref 19–32)
Chloride: 105 mEq/L (ref 96–112)
Creatinine, Ser: 0.8 mg/dL (ref 0.50–1.10)
GFR calc Af Amer: 88 mL/min — ABNORMAL LOW (ref >90)
GFR calc non Af Amer: 76 mL/min — ABNORMAL LOW (ref >90)
Glucose, Bld: 113 mg/dL — ABNORMAL HIGH (ref 70–99)
POTASSIUM: 3.6 meq/L — AB (ref 3.7–5.3)
SODIUM: 145 meq/L (ref 137–147)
TOTAL PROTEIN: 8.2 g/dL (ref 6.0–8.3)
Total Bilirubin: 0.2 mg/dL — ABNORMAL LOW (ref 0.3–1.2)

## 2014-06-01 LAB — CBC WITH DIFFERENTIAL/PLATELET
BASOS ABS: 0 10*3/uL (ref 0.0–0.1)
BASOS PCT: 1 % (ref 0–1)
EOS ABS: 0.2 10*3/uL (ref 0.0–0.7)
EOS PCT: 4 % (ref 0–5)
HCT: 39.1 % (ref 36.0–46.0)
Hemoglobin: 13.3 g/dL (ref 12.0–15.0)
Lymphocytes Relative: 48 % — ABNORMAL HIGH (ref 12–46)
Lymphs Abs: 2.4 10*3/uL (ref 0.7–4.0)
MCH: 28.4 pg (ref 26.0–34.0)
MCHC: 34 g/dL (ref 30.0–36.0)
MCV: 83.5 fL (ref 78.0–100.0)
Monocytes Absolute: 0.4 10*3/uL (ref 0.1–1.0)
Monocytes Relative: 8 % (ref 3–12)
Neutro Abs: 1.9 10*3/uL (ref 1.7–7.7)
Neutrophils Relative %: 38 % — ABNORMAL LOW (ref 43–77)
PLATELETS: 209 10*3/uL (ref 150–400)
RBC: 4.68 MIL/uL (ref 3.87–5.11)
RDW: 13.5 % (ref 11.5–15.5)
WBC: 5.1 10*3/uL (ref 4.0–10.5)

## 2014-06-01 LAB — OCCULT BLOOD X 1 CARD TO LAB, STOOL: FECAL OCCULT BLD: POSITIVE — AB

## 2014-06-01 LAB — PROTIME-INR
INR: 0.94 (ref 0.00–1.49)
Prothrombin Time: 12.6 seconds (ref 11.6–15.2)

## 2014-06-01 MED ORDER — HYDROCORTISONE 2.5 % RE CREA: TOPICAL_CREAM | RECTAL | Status: DC

## 2014-06-01 MED ORDER — CEPHALEXIN 500 MG PO CAPS: 500.0000 mg | ORAL_CAPSULE | Freq: Four times a day (QID) | ORAL | Status: DC

## 2014-06-01 MED ORDER — SODIUM CHLORIDE 0.9 % IV BOLUS (SEPSIS)
1000.0000 mL | Freq: Once | INTRAVENOUS | Status: AC
  Administered 2014-06-01: 1000 mL via INTRAVENOUS

## 2014-06-01 MED ORDER — DEXTROSE 5 % IV SOLN
1.0000 g | Freq: Once | INTRAVENOUS | Status: AC
  Administered 2014-06-01: 1 g via INTRAVENOUS

## 2014-06-01 MED ORDER — CEFTRIAXONE SODIUM 1 G IJ SOLR
INTRAMUSCULAR | Status: AC
  Filled 2014-06-01: qty 10

## 2014-06-01 NOTE — ED Notes (Signed)
Pt reports has had scant amt of rectal bleeding starting yesterday.  Noticed initially brb on toilet paper and in toilet water.  States today larger amount, only with stooling.  Constipation.  Denies abd pain.

## 2014-06-01 NOTE — ED Provider Notes (Signed)
CSN: [id number]     Arrival date & time 06/01/14  1734 History  This chart was scribed for Ezequiel Essex, MD by Jeanell Sparrow, ED Scribe. This patient was seen in room MH11/MH11 and the patient's care was started at 6:32 PM.   Chief Complaint  Patient presents with  . Rectal Bleeding   HPI HPI Comments: Robin Obrien is a 63 y.o. female who presents to the Emergency Department complaining of rectal bleeding that started yesterday. She states that she went to the bathroom and tried to have a BM but failed. She noticed blood in the toilet and in toilet paper. She reports 2 more episodes of this event. She states that in between episodes she has successful production of stool with no blood. She denies any pain during BMs. She states that she has had a colonoscopy, but it has been a long time. She denies any abdominal pain, emesis, dizziness, light-headedness, chest pain, or SOB.   Past Medical History  Diagnosis Date  . HYPOKALEMIA 03/18/2008  . ANXIETY 03/18/2008  . HYPERTENSION 03/18/2008  . ALLERGIC RHINITIS 03/18/2008  . GERD 03/18/2008  . Irritable bowel syndrome 01/01/2009  . CHOLELITHIASIS 03/18/2008  . RENAL CALCULUS, RIGHT 03/02/2010  . HEMATURIA UNSPECIFIED 03/01/2010  . 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 BACK PAIN 01/01/2009  . LEG CRAMPS 03/01/2010  . FLANK PAIN, RIGHT 03/01/2010  . LIVER FUNCTION TESTS, ABNORMAL 03/18/2008  . COLONIC POLYPS, HX OF 03/18/2008  . NEPHROLITHIASIS, HX OF 03/01/2010  . Preventative health care 04/03/2011  . Rectal fissure    Past Surgical History  Procedure Laterality Date  . Cholecystectomy  90's  . Cyst removed  1987    removed from left breast  . Fibroid tumor  1985   Family History  Problem Relation Age of Onset  . Diabetes Brother   . Arthritis Other   . Cancer Other     breast and lung cancer  . Heart disease Other   . Hypertension Other   . Breast cancer Mother   . Cancer Father    History  Substance Use Topics  . Smoking  status: Former Research scientist (life sciences)  . Smokeless tobacco: Not on file  . Alcohol Use: Yes     Comment: occ   OB History   Grav Para Term Preterm Abortions TAB SAB Ect Mult Living                 Review of Systems  A complete 10 system review of systems was obtained and all systems are negative except as noted in the HPI and PMH.   Allergies  Review of patient's allergies indicates no known allergies.  Home Medications   Prior to Admission medications   Medication Sig Start Date End Date Taking? Authorizing Provider  aspirin 81 MG EC tablet Take 81 mg by mouth daily.      Historical Provider, MD  benazepril (LOTENSIN) 20 MG tablet TAKE ONE TABLET BY MOUTH EVERY DAY 04/13/12   Biagio Borg, MD  cephALEXin (KEFLEX) 500 MG capsule Take 1 capsule (500 mg total) by mouth 4 (four) times daily. 06/01/14   Ezequiel Essex, MD  Cholecalciferol (VITAMIN D3) 1000 UNITS CAPS Take 2,000 Units by mouth daily.    Historical Provider, MD  Glucosamine Sulfate (FP GLUCOSAMINE PO) Take 2,000 mg by mouth daily.    Historical Provider, MD  hydrocortisone (ANUSOL-HC) 2.5 % rectal cream Apply rectally 2 times daily 06/01/14   Ezequiel Essex, MD  meloxicam (MOBIC) 15 MG tablet  Take 1 tablet (15 mg total) by mouth daily. 02/25/14   Rosalita Chessman, DO  Naproxen Sod-Diphenhydramine (ALEVE PM PO) Take 1 capsule by mouth as directed.    Historical Provider, MD   BP 161/100  Pulse 85  Temp(Src) 98.4 F (36.9 C) (Oral)  Resp 18  Ht 5\' 4"  (1.626 m)  Wt 163 lb (73.936 kg)  BMI 27.97 kg/m2  SpO2 100% Physical Exam  Nursing note and vitals reviewed. Constitutional: She is oriented to person, place, and time. She appears well-developed and well-nourished. No distress.  anxious  HENT:  Head: Normocephalic and atraumatic.  Mouth/Throat: Oropharynx is clear and moist.  Eyes: Conjunctivae and EOM are normal. Pupils are equal, round, and reactive to light.  Neck: Normal range of motion. Neck supple.  Cardiovascular: Normal  rate, regular rhythm and normal heart sounds.   No murmur heard. Pulmonary/Chest: Effort normal and breath sounds normal. No respiratory distress.  Abdominal: Soft. There is no tenderness. There is no rebound and no guarding.  Genitourinary:  Rectal exam shows external hemorrhoids. No fissures. Chaperone present. Small amount of gross blood on examining finger.  Musculoskeletal: Normal range of motion. She exhibits no edema and no tenderness.  Neurological: She is alert and oriented to person, place, and time. No cranial nerve deficit. She exhibits normal muscle tone. Coordination normal.  Skin: Skin is warm. No rash noted.    ED Course  Procedures (including critical care time) DIAGNOSTIC STUDIES: Oxygen Saturation is 100% on RA, normal by my interpretation.    COORDINATION OF CARE: 6:36 PM- Pt advised of plan for treatment which includes labs and pt agrees.  Labs Review Labs Reviewed  CBC WITH DIFFERENTIAL - Abnormal; Notable for the following:    Neutrophils Relative % 38 (*)    Lymphocytes Relative 48 (*)    All other components within normal limits  COMPREHENSIVE METABOLIC PANEL - Abnormal; Notable for the following:    Potassium 3.6 (*)    Glucose, Bld 113 (*)    BUN 26 (*)    Total Bilirubin 0.2 (*)    GFR calc non Af Amer 76 (*)    GFR calc Af Amer 88 (*)    Anion gap 17 (*)    All other components within normal limits  OCCULT BLOOD X 1 CARD TO LAB, STOOL - Abnormal; Notable for the following:    Fecal Occult Bld POSITIVE (*)    All other components within normal limits  URINALYSIS, ROUTINE W REFLEX MICROSCOPIC - Abnormal; Notable for the following:    APPearance CLOUDY (*)    Specific Gravity, Urine 1.031 (*)    Leukocytes, UA MODERATE (*)    All other components within normal limits  URINE MICROSCOPIC-ADD ON - Abnormal; Notable for the following:    Squamous Epithelial / LPF FEW (*)    Bacteria, UA MANY (*)    All other components within normal limits   PROTIME-INR    Imaging Review No results found.   EKG Interpretation None      MDM   Final diagnoses:  Rectal bleeding  Urinary tract infection with hematuria, site unspecified   Patient with episode of blood in the toilet water and blood with wiping after straining while trying to have a bowel movement. Stool brown and normal. Denies abdominal pain, chest pain, vomiting. No blood thinner use. Bleeding is likely hemorrhoidal and patient has been straining on toilet.  Denies bloody or black stools.  Hemoglobin stable. Fecal occult blood is positive.  Urinalysis is positive for infection. Orthostatics were positive heart rate elevated to 108 with standing but blood pressure remained stable. IVF given.  Patient was recommended to have CT scan in setting of rectal bleeding. She is refusing this test after the CT tech told her she might be at risk for getting kidney failure. I tried to reassure her that her kidney function is normal and her risk is minimal because she has no history of kidney problems and has had contrast in the past. Patient remains apprehensive and prefers to have this test obtained by her doctor.  Repeat orthostatics are negative.  UTI treated. No further bleeding in the ED.  No abdominal pain.  Patient continues to decline CT scan.  She is aware that pathology such as diverticula or colon mass could be missed. She states she will follow up with her doctor and return to the ED if she develops worsening bleeding or pain.    BP 161/100  Pulse 85  Temp(Src) 98.4 F (36.9 C) (Oral)  Resp 18  Ht 5\' 4"  (1.626 m)  Wt 163 lb (73.936 kg)  BMI 27.97 kg/m2  SpO2 100%   I personally performed the services described in this documentation, which was scribed in my presence. The recorded information has been reviewed and is accurate.    Ezequiel Essex, MD 06/02/14 [phone]

## 2014-06-01 NOTE — Discharge Instructions (Signed)
Bloody Stools CT scan was recommended for your rectal bleeding. You declined this test. Take antibiotics as prescribed and follow up with your doctor. Return to the ED if you develop worsening rectal bleeding, abdominal pain, dizziness or any other concerns Bloody stools often mean that there is a problem in the digestive tract. Your caregiver may use the term "melena" to describe black, tarry, and bad smelling stools or "hematochezia" to describe red or maroon-colored stools. Blood seen in the stool can be caused by bleeding anywhere along the intestinal tract.  A black stool usually means that blood is coming from the upper part of the gastrointestinal tract (esophagus, stomach, or small bowel). Passing maroon-colored stools or bright red blood usually means that blood is coming from lower down in the large bowel or the rectum. However, sometimes massive bleeding in the stomach or small intestine can cause bright red bloody stools.  Consuming black licorice, lead, iron pills, medicines containing bismuth subsalicylate, or blueberries can also cause black stools. Your caregiver can test black stools to see if blood is present. It is important that the cause of the bleeding be found. Treatment can then be started, and the problem can be corrected. Rectal bleeding may not be serious, but you should not assume everything is okay until you know the cause.It is very important to follow up with your caregiver or a specialist in gastrointestinal problems. CAUSES  Blood in the stools can come from various underlying causes.Often, the cause is not found during your first visit. Testing is often needed to discover the cause of bleeding in the gastrointestinal tract. Causes range from simple to serious or even life-threatening.Possible causes include:  Hemorrhoids.These are veins that are full of blood (engorged) in the rectum. They cause pain, inflammation, and may bleed.  Anal fissures.These are areas of  painful tearing which may bleed. They are often caused by passing hard stool.  Diverticulosis.These are pouches that form on the colon over time, with age, and may bleed significantly.  Diverticulitis.This is inflammation in areas with diverticulosis. It can cause pain, fever, and bloody stools, although bleeding is rare.  Proctitis and colitis. These are inflamed areas of the rectum or colon. They may cause pain, fever, and bloody stools.  Polyps and cancer. Colon cancer is a leading cause of preventable cancer death.It often starts out as precancerous polyps that can be removed during a colonoscopy, preventing progression into cancer. Sometimes, polyps and cancer may cause rectal bleeding.  Gastritis and ulcers.Bleeding from the upper gastrointestinal tract (near the stomach) may travel through the intestines and produce black, sometimes tarry, often bad smelling stools. In certain cases, if the bleeding is fast enough, the stools may not be black, but red and the condition may be life-threatening. SYMPTOMS  You may have stools that are bright red and bloody, that are normal color with blood on them, or that are dark black and tarry. In some cases, you may only have blood in the toilet bowl. Any of these cases need medical care. You may also have:  Pain at the anus or anywhere in the rectum.  Lightheadedness or feeling faint.  Extreme weakness.  Nausea or vomiting.  Fever. DIAGNOSIS Your caregiver may use the following methods to find the cause of your bleeding:  Taking a medical history. Age is important. Older people tend to develop polyps and cancer more often. If there is anal pain and a hard, large stool associated with bleeding, a tear of the anus may be the  cause. If blood drips into the toilet after a bowel movement, bleeding hemorrhoids may be the problem. The color and frequency of the bleeding are additional considerations. In most cases, the medical history provides clues,  but seldom the final answer.  A visual and finger (digital) exam. Your caregiver will inspect the anal area, looking for tears and hemorrhoids. A finger exam can provide information when there is tenderness or a growth inside. In men, the prostate is also examined.  Endoscopy. Several types of small, long scopes (endoscopes) are used to view the colon.  In the office, your caregiver may use a rigid, or more commonly, a flexible viewing sigmoidoscope. This exam is called flexible sigmoidoscopy. It is performed in 5 to 10 minutes.  A more thorough exam is accomplished with a colonoscope. It allows your caregiver to view the entire 5 to 6 foot long colon. Medicine to help you relax (sedative) is usually given for this exam. Frequently, a bleeding lesion may be present beyond the reach of the sigmoidoscope. So, a colonoscopy may be the best exam to start with. Both exams are usually done on an outpatient basis. This means the patient does not stay overnight in the hospital or surgery center.  An upper endoscopy may be needed to examine your stomach. Sedation is used and a flexible endoscope is put in your mouth, down to your stomach.  A barium enema X-ray. This is an X-ray exam. It uses liquid barium inserted by enema into the rectum. This test alone may not identify an actual bleeding point. X-rays highlight abnormal shadows, such as those made by lumps (tumors), diverticuli, or colitis. TREATMENT  Treatment depends on the cause of your bleeding.   For bleeding from the stomach or colon, the caregiver doing your endoscopy or colonoscopy may be able to stop the bleeding as part of the procedure.  Inflammation or infection of the colon can be treated with medicines.  Many rectal problems can be treated with creams, suppositories, or warm baths.  Surgery is sometimes needed.  Blood transfusions are sometimes needed if you have lost a lot of blood.  For any bleeding problem, let your caregiver  know if you take aspirin or other blood thinners regularly. HOME CARE INSTRUCTIONS   Take any medicines exactly as prescribed.  Keep your stools soft by eating a diet high in fiber. Prunes (1 to 3 a day) work well for many people.  Drink enough water and fluids to keep your urine clear or pale yellow.  Take sitz baths if advised. A sitz bath is when you sit in a bathtub with warm water for 10 to 15 minutes to soak, soothe, and cleanse the rectal area.  If enemas or suppositories are advised, be sure you know how to use them. Tell your caregiver if you have problems with this.  Monitor your bowel movements to look for signs of improvement or worsening. SEEK MEDICAL CARE IF:   You do not improve in the time expected.  Your condition worsens after initial improvement.  You develop any new symptoms. SEEK IMMEDIATE MEDICAL CARE IF:   You develop severe or prolonged rectal bleeding.  You vomit blood.  You feel weak or faint.  You have a fever. MAKE SURE YOU:  Understand these instructions.  Will watch your condition.  Will get help right away if you are not doing well or get worse. Document Released: 10/28/2002 Document Revised: 01/30/2012 Document Reviewed: 03/25/2011 [facility] Patient Information 2015 [place], [place]. This information is  not intended to replace advice given to you by your health care provider. Make sure you discuss any questions you have with your health care provider.

## 2014-06-03 ENCOUNTER — Telehealth: Payer: Self-pay | Admitting: *Deleted

## 2014-06-03 NOTE — Telephone Encounter (Signed)
Please make appointment this week

## 2014-06-03 NOTE — Telephone Encounter (Signed)
Caller name:  Jonny Relation to pt:  self Call back number: [phone]  Pharmacy:  Reason for call: Pt went to ER on Saturday evening for seeing blood in stool.  They diagnosed her with a UTI and Hemorrhoids.  They wanted to do a CT scan, but she declined.  They gave her antibiotics and fluids through IV while there, and a prescription for antibiotic that she is taking to get filled today.  Also gave her something for the hemorrhoids that she is also getting filled today.  They recommended her to see [facility] as soon as possible.  Pt wants to discuss this with you before doing the CT scan.  She stopped taking meloxicam (MOBIC) 15 MG tablet after going to ER and also stopped aleve.  Since then, she has not seen any more bleeding.  The nurse at the ER told her the meloxicam can cause bleeding, so she took it upon herself to quit taking it.  The reason she did not do CT scan at ER is because the nurse that brought the stuff she would have to drink to coat her insides questioned her history and informed her it could cause kidney failure.  Pt decided against the CT stating she needed to discuss this with Dr Etter Sjogren first.  Please advise pt of any recommendations, or if she need to make an appt to see you to discuss all of this.

## 2014-06-03 NOTE — Telephone Encounter (Signed)
To MD for review     KP 

## 2014-06-10 NOTE — Telephone Encounter (Signed)
Pt has appt 06/16/14 for hosp FU.  bw

## 2014-06-16 ENCOUNTER — Ambulatory Visit (INDEPENDENT_AMBULATORY_CARE_PROVIDER_SITE_OTHER): Payer: Medicare Other | Admitting: Family Medicine

## 2014-06-16 ENCOUNTER — Encounter: Payer: Self-pay | Admitting: Family Medicine

## 2014-06-16 VITALS — BP 144/82 | HR 86 | Temp 98.5°F | Wt 164.2 lb

## 2014-06-16 DIAGNOSIS — R1013 Epigastric pain: Secondary | ICD-10-CM | POA: Diagnosis not present

## 2014-06-16 DIAGNOSIS — K3189 Other diseases of stomach and duodenum: Secondary | ICD-10-CM | POA: Diagnosis not present

## 2014-06-16 DIAGNOSIS — R195 Other fecal abnormalities: Secondary | ICD-10-CM | POA: Diagnosis not present

## 2014-06-16 DIAGNOSIS — N390 Urinary tract infection, site not specified: Secondary | ICD-10-CM | POA: Diagnosis not present

## 2014-06-16 LAB — POCT URINALYSIS DIPSTICK
Bilirubin, UA: NEGATIVE
Blood, UA: NEGATIVE
Glucose, UA: NEGATIVE
KETONES UA: NEGATIVE
Nitrite, UA: NEGATIVE
PH UA: 6
Protein, UA: NEGATIVE
SPEC GRAV UA: 1.01
Urobilinogen, UA: 0.2

## 2014-06-16 MED ORDER — OMEPRAZOLE 20 MG PO CPDR: 20.0000 mg | DELAYED_RELEASE_CAPSULE | Freq: Every day | ORAL | Status: DC

## 2014-06-16 NOTE — Patient Instructions (Signed)
Rectal Bleeding °Rectal bleeding is when blood passes out of the anus. It is usually a sign that something is wrong. It may not be serious, but it should always be evaluated. Rectal bleeding may present as bright red blood or extremely dark stools. The color may range from dark red or maroon to black (like tar). It is important that the cause of rectal bleeding be identified so treatment can be started and the problem corrected. °CAUSES  °· Hemorrhoids. These are enlarged (dilated) blood vessels or veins in the anal or rectal area. °· Fistulas. These are abnormal, burrowing channels that usually run from inside the rectum to the skin around the anus. They can bleed. °· Anal fissures. This is a tear in the tissue of the anus. Bleeding occurs with bowel movements. °· Diverticulosis. This is a condition in which pockets or sacs project from the bowel wall. Occasionally, the sacs can bleed. °· Diverticulitis. This is an infection involving diverticulosis of the colon. °· Proctitis and colitis. These are conditions in which the rectum, colon, or both, can become inflamed and pitted (ulcerated). °· Polyps and cancer. Polyps are non-cancerous (benign) growths in the colon that may bleed. Certain types of polyps turn into cancer. °· Protrusion of the rectum. Part of the rectum can project from the anus and bleed. °· Certain medicines. °· Intestinal infections. °· Blood vessel abnormalities. °HOME CARE INSTRUCTIONS °· Eat a high-fiber diet to keep your stool soft. °· Limit activity. °· Drink enough fluids to keep your urine clear or pale yellow. °· Warm baths may be useful to soothe rectal pain. °· Follow up with your caregiver as directed. °SEEK IMMEDIATE MEDICAL CARE IF: °· You develop increased bleeding. °· You have black or dark red stools. °· You vomit blood or material that looks like coffee grounds. °· You have abdominal pain or tenderness. °· You have a fever. °· You feel weak, nauseous, or you faint. °· You have  severe rectal pain or you are unable to have a bowel movement. °MAKE SURE YOU: °· Understand these instructions. °· Will watch your condition. °· Will get help right away if you are not doing well or get worse. °Document Released: 04/29/2002 Document Revised: 01/30/2012 Document Reviewed: 04/24/2011 °[employer]® Patient Information ©2015 [employer]. This information is not intended to replace advice given to you by your health care provider. Make sure you discuss any questions you have with your health care provider. ° °

## 2014-06-16 NOTE — Progress Notes (Signed)
   Subjective:    Patient ID: Robin Obrien, female    DOB: September 28, 1951, 63 y.o.   MRN: [id number]  HPI Pt here f/u ED for rectal bleeding and uti.  Pt states she went to the ER 06/01/14 because she noticed blood in toilet and on TP on 2 different occassions and it was worse the second time so she went to to ER  -- no abd pain but she states she did have some inc burping and indigestion.  Pt was also found to have an abn urine and was put on keflex which has caused abd pain so pt stopped it.  No other complaints.  See ER visit   Review of Systems As above    Objective:   Physical Exam BP 144/82  Pulse 86  Temp(Src) 98.5 F (36.9 C) (Oral)  Wt 164 lb 3.2 oz (74.481 kg)  SpO2 98% General appearance: alert, cooperative, appears stated age and no distress Lungs: clear to auscultation bilaterally Heart: S1, S2 normal Abdomen: soft, non-tender; bowel sounds normal; no masses,  no organomegaly       Assessment & Plan:  1. Urinary tract infection, site not specified Stop keflex-- wait for culture unless symptoms worsen - POCT Urinalysis Dipstick - Urine Culture  2. Heme positive stool Check home guiac cards, omeprazole 20 mg daily - Ambulatory referral to Gastroenterology  3. Dyspepsia-- omeprazole daily,  GI

## 2014-06-16 NOTE — Progress Notes (Signed)
Pre visit review using our clinic review tool, if applicable. No additional management support is needed unless otherwise documented below in the visit note. 

## 2014-06-18 LAB — URINE CULTURE: Colony Count: 4000

## 2014-06-19 ENCOUNTER — Encounter: Payer: Self-pay | Admitting: Internal Medicine

## 2014-06-19 ENCOUNTER — Telehealth: Payer: Self-pay | Admitting: Family Medicine

## 2014-06-19 NOTE — Telephone Encounter (Signed)
Error

## 2014-06-19 NOTE — Telephone Encounter (Signed)
Caller name: Laria  Call back number:[phone]   Reason for call:  Pt wants to the results to urinalysis.

## 2014-06-20 ENCOUNTER — Telehealth: Payer: Self-pay | Admitting: Internal Medicine

## 2014-06-20 NOTE — Telephone Encounter (Signed)
Rec'd from [facility] forward 11 pages to Dr. Raquel James

## 2014-06-20 NOTE — Telephone Encounter (Signed)
Patient aware Urine culture Negative      KP

## 2014-07-11 ENCOUNTER — Other Ambulatory Visit (INDEPENDENT_AMBULATORY_CARE_PROVIDER_SITE_OTHER): Payer: Medicare Other

## 2014-07-11 ENCOUNTER — Telehealth: Payer: Self-pay | Admitting: *Deleted

## 2014-07-11 DIAGNOSIS — K625 Hemorrhage of anus and rectum: Secondary | ICD-10-CM | POA: Diagnosis not present

## 2014-07-11 LAB — POC HEMOCCULT BLD/STL (HOME/3-CARD/SCREEN)
Card #2 Fecal Occult Blod, POC: NEGATIVE
FECAL OCCULT BLD: NEGATIVE
FECAL OCCULT BLD: NEGATIVE

## 2014-07-11 NOTE — Telephone Encounter (Signed)
Informed pt of recent hemoccult card (x3) results.  Pt voiced understanding.//AB/CMA

## 2014-08-25 ENCOUNTER — Encounter: Payer: Self-pay | Admitting: Internal Medicine

## 2014-08-25 ENCOUNTER — Ambulatory Visit (INDEPENDENT_AMBULATORY_CARE_PROVIDER_SITE_OTHER): Payer: Medicare Other | Admitting: Internal Medicine

## 2014-08-25 VITALS — BP 122/72 | HR 84 | Ht 64.5 in | Wt 161.2 lb

## 2014-08-25 DIAGNOSIS — K649 Unspecified hemorrhoids: Secondary | ICD-10-CM | POA: Diagnosis not present

## 2014-08-25 DIAGNOSIS — Z1211 Encounter for screening for malignant neoplasm of colon: Secondary | ICD-10-CM | POA: Diagnosis not present

## 2014-08-25 NOTE — Progress Notes (Signed)
Patient ID: Robin Obrien, female   DOB: [birth year], 63 y.o.   MRN: [id number] HPI: Robin Obrien is a 63 year old female with a past medical history of hypertension, IBS, kidney stones, who is seen in consultation at the request of Dr. Etter Sjogren to evaluate an episode of rectal bleeding and also consider screening colonoscopy. She is here alone today. She reports she is feeling well. 2 months ago she had several episodes of bright red blood per rectum with bowel movement seen on the toilet tissue and in the toilet water. She was seen in the ED and told it was probably her hemorrhoids, stool is heme positive on that day. She then saw primary care at home FOBT performed which was negative x3. She has not seen further blood or had any hemorrhoidal pain or discomfort. She denies abdominal pain. Reports regular bowel habits without blood in her stool or melena. Has a history of gastritis, H. pylori negative but denies any upper GI complaints today. No heartburn, dysphagia or odynophagia. No epigastric pain or dyspepsia. Hepatobiliary complaint. She reports her irritable bowel symptoms have been quiesced for multiple years. No family history of colon cancer though her father died of liver cancer which was felt to be metastatic of unknown primary.  She did have colonoscopy which was performed at [facility] on 01/11/2002 which was normal. EGD performed the same day was unremarkable  Past Medical History  Diagnosis Date  . HYPOKALEMIA 03/18/2008  . ANXIETY 03/18/2008  . HYPERTENSION 03/18/2008  . ALLERGIC RHINITIS 03/18/2008  . GERD 03/18/2008  . Irritable bowel syndrome 01/01/2009  . CHOLELITHIASIS 03/18/2008  . RENAL CALCULUS, RIGHT 03/02/2010  . HEMATURIA UNSPECIFIED 03/01/2010  . 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 BACK PAIN 01/01/2009  . LEG CRAMPS 03/01/2010  . FLANK PAIN, RIGHT 03/01/2010  . LIVER FUNCTION TESTS, ABNORMAL 03/18/2008  . COLONIC POLYPS, HX OF 03/18/2008  . NEPHROLITHIASIS, HX OF 03/01/2010  .  Preventative health care 04/03/2011  . Rectal fissure     Past Surgical History  Procedure Laterality Date  . Cholecystectomy  90's  . Cyst removed  1987    removed from left breast  . Fibroid tumor  1985    Outpatient Prescriptions Prior to Visit  Medication Sig Dispense Refill  . aspirin 81 MG EC tablet Take 81 mg by mouth daily.        . benazepril (LOTENSIN) 20 MG tablet TAKE ONE TABLET BY MOUTH EVERY DAY  90 tablet  3  . Cholecalciferol (VITAMIN D3) 1000 UNITS CAPS Take 2,000 Units by mouth daily.      . Glucosamine Sulfate (FP GLUCOSAMINE PO) Take 2,000 mg by mouth daily.      Marland Kitchen omeprazole (PRILOSEC) 20 MG capsule Take 1 capsule (20 mg total) by mouth daily.  30 capsule  3  . hydrocortisone (ANUSOL-HC) 2.5 % rectal cream Apply rectally 2 times daily  30 g  0   No facility-administered medications prior to visit.    No Known Allergies  Family History  Problem Relation Age of Onset  . Diabetes Brother   . Arthritis Other   . Cancer Other     breast and lung cancer  . Heart disease Other   . Hypertension Other   . Breast cancer Mother   . Cancer Father     History  Substance Use Topics  . Smoking status: Former Research scientist (life sciences)  . Smokeless tobacco: Not on file  . Alcohol Use: Yes     Comment: occ  ROS: As per history of present illness, otherwise negative  BP 122/72  Pulse 84  Ht 5' 4.5" (1.638 m)  Wt 161 lb 4 oz (73.143 kg)  BMI 27.26 kg/m2 Constitutional: Well-developed and well-nourished. No distress. HEENT: Normocephalic and atraumatic. Oropharynx is clear and moist. No oropharyngeal exudate. Conjunctivae are normal.  No scleral icterus. Neck: Neck supple. Trachea midline. Cardiovascular: Normal rate, regular rhythm and intact distal pulses. No M/R/G Pulmonary/chest: Effort normal and breath sounds normal. No wheezing, rales or rhonchi. Abdominal: Soft, nontender, nondistended. Bowel sounds active throughout. There are no masses palpable. No  hepatosplenomegaly. Extremities: no clubbing, cyanosis, or edema Lymphadenopathy: No cervical adenopathy noted. Neurological: Alert and oriented to person place and time. Psychiatric: Normal mood and affect. Behavior is normal.  RELEVANT LABS AND IMAGING: CBC    Component Value Date/Time   WBC 5.1 06/01/2014 1900   RBC 4.68 06/01/2014 1900   HGB 13.3 06/01/2014 1900   HCT 39.1 06/01/2014 1900   PLT 209 06/01/2014 1900   MCV 83.5 06/01/2014 1900   MCH 28.4 06/01/2014 1900   MCHC 34.0 06/01/2014 1900   RDW 13.5 06/01/2014 1900   LYMPHSABS 2.4 06/01/2014 1900   MONOABS 0.4 06/01/2014 1900   EOSABS 0.2 06/01/2014 1900   BASOSABS 0.0 06/01/2014 1900    CMP     Component Value Date/Time   NA 145 06/01/2014 1900   K 3.6* 06/01/2014 1900   CL 105 06/01/2014 1900   CO2 23 06/01/2014 1900   GLUCOSE 113* 06/01/2014 1900   BUN 26* 06/01/2014 1900   CREATININE 0.80 06/01/2014 1900   CALCIUM 9.7 06/01/2014 1900   PROT 8.2 06/01/2014 1900   ALBUMIN 4.4 06/01/2014 1900   AST 23 06/01/2014 1900   ALT 18 06/01/2014 1900   ALKPHOS 81 06/01/2014 1900   BILITOT 0.2* 06/01/2014 1900   GFRNONAA 76* 06/01/2014 1900   GFRAA 88* 06/01/2014 1900    ASSESSMENT/PLAN: 63 year old female with a past medical history of hypertension, IBS, kidney stones, who is seen in consultation at the request of Dr. Etter Sjogren to evaluate an episode of rectal bleeding and also consider screening colonoscopy.   1. CRC screening/hemorrhoids with rectal bleeding -- her hemorrhoids were the likely cause of her isolated rectal bleeding 2 months ago. No bleeding since and stool was subsequently heme-negative. I recommended screening colonoscopy because it has been 12 years since her last exam. We discussed the test including the risks and benefits and she is agreeable to proceed. We will evaluate for hemorrhoids at the time of colonoscopy and can recommend treatment if this is found to be an issue. Her the recommendations thereafter  2. IBS -- history  of without current symptoms.

## 2014-08-25 NOTE — Patient Instructions (Signed)
It has been recommended to you by your physician that you have a(n) Colonoscopy completed. Per your request, we did not schedule the procedure(s) today. Please contact our office at [phone] should you decide to have the procedure completed.  Call the middle of November for a January schedule

## 2014-09-19 DIAGNOSIS — M129 Arthropathy, unspecified: Secondary | ICD-10-CM | POA: Diagnosis not present

## 2014-09-19 DIAGNOSIS — M5136 Other intervertebral disc degeneration, lumbar region: Secondary | ICD-10-CM | POA: Diagnosis not present

## 2014-10-01 DIAGNOSIS — M129 Arthropathy, unspecified: Secondary | ICD-10-CM | POA: Diagnosis not present

## 2014-10-20 ENCOUNTER — Ambulatory Visit: Payer: Medicare Other | Admitting: Family Medicine

## 2014-11-25 ENCOUNTER — Encounter: Payer: Self-pay | Admitting: Internal Medicine

## 2014-12-12 ENCOUNTER — Ambulatory Visit: Payer: Medicare Other | Admitting: Family Medicine

## 2014-12-17 ENCOUNTER — Telehealth: Payer: Self-pay | Admitting: Family Medicine

## 2014-12-17 NOTE — Telephone Encounter (Signed)
Caller name: Haelie, Clapp Relation to pt: self  Call back number: [phone] Pharmacy: [facility] [phone]  Reason for call:  Pt is requesting a refill benazepril (LOTENSIN) 20 MG tablet . Please send to [facility], [place], [place] [address] [phone]

## 2014-12-18 MED ORDER — BENAZEPRIL HCL 20 MG PO TABS: 20.0000 mg | ORAL_TABLET | Freq: Every day | ORAL | Status: DC

## 2014-12-18 NOTE — Telephone Encounter (Signed)
Rx faxed.    KP 

## 2014-12-26 ENCOUNTER — Ambulatory Visit: Payer: Medicare Other | Admitting: Family Medicine

## 2014-12-31 DIAGNOSIS — M25552 Pain in left hip: Secondary | ICD-10-CM | POA: Diagnosis not present

## 2014-12-31 DIAGNOSIS — M160 Bilateral primary osteoarthritis of hip: Secondary | ICD-10-CM | POA: Diagnosis not present

## 2015-01-05 ENCOUNTER — Ambulatory Visit: Payer: Medicare Other | Admitting: Family Medicine

## 2015-01-08 ENCOUNTER — Ambulatory Visit (INDEPENDENT_AMBULATORY_CARE_PROVIDER_SITE_OTHER): Payer: Medicare Other | Admitting: Family Medicine

## 2015-01-08 ENCOUNTER — Encounter: Payer: Self-pay | Admitting: Family Medicine

## 2015-01-08 VITALS — BP 130/80 | HR 77 | Temp 98.8°F | Wt 158.2 lb

## 2015-01-08 DIAGNOSIS — I10 Essential (primary) hypertension: Secondary | ICD-10-CM | POA: Diagnosis not present

## 2015-01-08 DIAGNOSIS — R1013 Epigastric pain: Secondary | ICD-10-CM | POA: Diagnosis not present

## 2015-01-08 DIAGNOSIS — R002 Palpitations: Secondary | ICD-10-CM | POA: Diagnosis not present

## 2015-01-08 DIAGNOSIS — M25552 Pain in left hip: Secondary | ICD-10-CM | POA: Diagnosis not present

## 2015-01-08 DIAGNOSIS — M25551 Pain in right hip: Secondary | ICD-10-CM

## 2015-01-08 MED ORDER — OMEPRAZOLE 20 MG PO CPDR: 20.0000 mg | DELAYED_RELEASE_CAPSULE | Freq: Every day | ORAL | Status: DC

## 2015-01-08 NOTE — Progress Notes (Signed)
Subjective:    Patient ID: Robin Obrien, female    DOB: [birth year], 64 y.o.   MRN: [id number]  HPI  Patient here for f/u htn and c/o hip pain.  No known injury.  Pt is known to have arthritis.  Past Medical History  Diagnosis Date  . HYPOKALEMIA 03/18/2008  . ANXIETY 03/18/2008  . HYPERTENSION 03/18/2008  . ALLERGIC RHINITIS 03/18/2008  . GERD 03/18/2008  . Irritable bowel syndrome 01/01/2009  . CHOLELITHIASIS 03/18/2008  . RENAL CALCULUS, RIGHT 03/02/2010  . HEMATURIA UNSPECIFIED 03/01/2010  . 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 BACK PAIN 01/01/2009  . LEG CRAMPS 03/01/2010  . FLANK PAIN, RIGHT 03/01/2010  . LIVER FUNCTION TESTS, ABNORMAL 03/18/2008  . COLONIC POLYPS, HX OF 03/18/2008  . NEPHROLITHIASIS, HX OF 03/01/2010  . Preventative health care 04/03/2011  . Rectal fissure     Review of Systems  Constitutional: Negative for activity change, appetite change, fatigue and unexpected weight change.  Respiratory: Negative for cough and shortness of breath.   Cardiovascular: Positive for palpitations. Negative for chest pain.  Musculoskeletal: Positive for arthralgias.       + hip pain  Psychiatric/Behavioral: Negative for behavioral problems and dysphoric mood. The patient is not nervous/anxious.        Objective:    Physical Exam  Constitutional: She is oriented to person, place, and time. She appears well-developed and well-nourished. No distress.  HENT:  Right Ear: External ear normal.  Left Ear: External ear normal.  Nose: Nose normal.  Mouth/Throat: Oropharynx is clear and moist.  Eyes: EOM are normal. Pupils are equal, round, and reactive to light.  Neck: Normal range of motion. Neck supple.  Cardiovascular: Normal rate, regular rhythm and normal heart sounds.   No murmur heard. Pulmonary/Chest: Effort normal and breath sounds normal. No respiratory distress. She has no wheezes. She has no rales. She exhibits no tenderness.  Neurological: She is alert and oriented  to person, place, and time.  Psychiatric: She has a normal mood and affect. Her behavior is normal. Judgment and thought content normal.    BP 130/80 mmHg  Pulse 77  Temp(Src) 98.8 F (37.1 C) (Oral)  Wt 158 lb 3.2 oz (71.759 kg)  SpO2 99% Wt Readings from Last 3 Encounters:  01/08/15 158 lb 3.2 oz (71.759 kg)  08/25/14 161 lb 4 oz (73.143 kg)  06/16/14 164 lb 3.2 oz (74.481 kg)     Lab Results  Component Value Date   WBC 5.1 06/01/2014   HGB 13.3 06/01/2014   HCT 39.1 06/01/2014   PLT 209 06/01/2014   GLUCOSE 113* 06/01/2014   CHOL 196 04/18/2014   TRIG 93.0 04/18/2014   HDL 51.70 04/18/2014   LDLCALC 126* 04/18/2014   ALT 18 06/01/2014   AST 23 06/01/2014   NA 145 06/01/2014   K 3.6* 06/01/2014   CL 105 06/01/2014   CREATININE 0.80 06/01/2014   BUN 26* 06/01/2014   CO2 23 06/01/2014   TSH 2.36 04/12/2013   INR 0.94 06/01/2014    No results found.     Assessment & Plan:   Problem List Items Addressed This Visit    Essential hypertension    Stable con't meds con't benazepril.      Bilateral hip pain    Secondary to arthritis con't mobic       Other Visit Diagnoses    Palpitations    -  Primary    Relevant Orders    EKG 12-Lead (Completed)  Dyspepsia        Relevant Medications    omeprazole (PRILOSEC) capsule        Garnet Koyanagi, DO

## 2015-01-08 NOTE — Patient Instructions (Signed)

## 2015-01-08 NOTE — Progress Notes (Signed)
Pre visit review using our clinic review tool, if applicable. No additional management support is needed unless otherwise documented below in the visit note. 

## 2015-01-09 NOTE — Assessment & Plan Note (Addendum)
Stable con't meds con't benazepril.

## 2015-01-09 NOTE — Assessment & Plan Note (Signed)
Secondary to arthritis con't mobic

## 2015-02-16 DIAGNOSIS — H3581 Retinal edema: Secondary | ICD-10-CM | POA: Diagnosis not present

## 2015-02-16 DIAGNOSIS — H3562 Retinal hemorrhage, left eye: Secondary | ICD-10-CM | POA: Diagnosis not present

## 2015-02-16 DIAGNOSIS — H34812 Central retinal vein occlusion, left eye: Secondary | ICD-10-CM | POA: Diagnosis not present

## 2015-02-16 DIAGNOSIS — H2513 Age-related nuclear cataract, bilateral: Secondary | ICD-10-CM | POA: Diagnosis not present

## 2015-02-17 ENCOUNTER — Telehealth: Payer: Self-pay | Admitting: Family Medicine

## 2015-02-17 NOTE — Telephone Encounter (Signed)
Have not seen notes---- will review when then come in

## 2015-02-17 NOTE — Telephone Encounter (Signed)
Caller name: [facility]  Call back number: [phone]  :  Reason for call:  [facility] is requesting a Vascular workup due to patient being seen in their office 02/16/15 . [facility] faxed over office notes.

## 2015-02-17 NOTE — Telephone Encounter (Signed)
To MD for review     KP 

## 2015-02-18 ENCOUNTER — Telehealth: Payer: Self-pay | Admitting: Family Medicine

## 2015-02-18 NOTE — Telephone Encounter (Signed)
Jess when these notes come would you place them in Dr.Lowne red folder.     KP

## 2015-02-18 NOTE — Telephone Encounter (Signed)
Caller name: Ambriella, Kitt Relation to pt: self  Call back number: [phone]   Reason for call:  Pt requesting a pain medication for stiffness and ache ness in legs, pt states she does not want meloxicam (MOBIC) 15 MG tablet

## 2015-02-19 MED ORDER — TRAMADOL HCL 50 MG PO TABS: 50.0000 mg | ORAL_TABLET | Freq: Four times a day (QID) | ORAL | Status: DC | PRN

## 2015-02-19 NOTE — Telephone Encounter (Signed)
Try ultram 50 mg 1 po q6h prn #30  1 refill---  If pain worse may need referral

## 2015-02-19 NOTE — Telephone Encounter (Signed)
Spoke with patient and she stated she was taking the Meloxicam for aches and stiffness in her legs and she does not want to take the Meloxicam anymore because she is having some hemorrhaging in her eye. She said she does not want anything that cause other issues just wants something to help her so she can walk.     KP

## 2015-02-19 NOTE — Telephone Encounter (Signed)
VM left advising Rx faxed.   KP 

## 2015-03-06 DIAGNOSIS — H3581 Retinal edema: Secondary | ICD-10-CM | POA: Diagnosis not present

## 2015-03-06 DIAGNOSIS — H34812 Central retinal vein occlusion, left eye: Secondary | ICD-10-CM | POA: Diagnosis not present

## 2015-03-06 DIAGNOSIS — H3562 Retinal hemorrhage, left eye: Secondary | ICD-10-CM | POA: Diagnosis not present

## 2015-03-06 DIAGNOSIS — H35031 Hypertensive retinopathy, right eye: Secondary | ICD-10-CM | POA: Diagnosis not present

## 2015-03-27 ENCOUNTER — Telehealth: Payer: Self-pay | Admitting: Family Medicine

## 2015-03-27 NOTE — Telephone Encounter (Signed)
error:[id number] ° °

## 2015-03-30 ENCOUNTER — Telehealth: Payer: Self-pay | Admitting: *Deleted

## 2015-03-30 NOTE — Telephone Encounter (Signed)
Pt dropped off an application for disability placard. Form filled out as much as possible and forwarded to Dr. Etter Sjogren. JG//CMA

## 2015-04-01 NOTE — Telephone Encounter (Signed)
Called and left message for pt informing her that form is completed and is up front for her to come by and pick up. Copy sent for scanning. JG//CMA

## 2015-04-15 ENCOUNTER — Other Ambulatory Visit: Payer: Self-pay

## 2015-04-15 DIAGNOSIS — Z0000 Encounter for general adult medical examination without abnormal findings: Secondary | ICD-10-CM

## 2015-04-17 ENCOUNTER — Ambulatory Visit (INDEPENDENT_AMBULATORY_CARE_PROVIDER_SITE_OTHER): Payer: Medicare Other | Admitting: Family Medicine

## 2015-04-17 ENCOUNTER — Encounter: Payer: Self-pay | Admitting: Family Medicine

## 2015-04-17 VITALS — BP 126/74 | HR 84 | Temp 98.2°F | Resp 16 | Ht 64.0 in | Wt 155.0 lb

## 2015-04-17 DIAGNOSIS — E2839 Other primary ovarian failure: Secondary | ICD-10-CM | POA: Diagnosis not present

## 2015-04-17 DIAGNOSIS — H34812 Central retinal vein occlusion, left eye: Secondary | ICD-10-CM | POA: Diagnosis not present

## 2015-04-17 DIAGNOSIS — Z1239 Encounter for other screening for malignant neoplasm of breast: Secondary | ICD-10-CM

## 2015-04-17 DIAGNOSIS — Z0000 Encounter for general adult medical examination without abnormal findings: Secondary | ICD-10-CM

## 2015-04-17 DIAGNOSIS — Z1231 Encounter for screening mammogram for malignant neoplasm of breast: Secondary | ICD-10-CM | POA: Diagnosis not present

## 2015-04-17 DIAGNOSIS — M8580 Other specified disorders of bone density and structure, unspecified site: Secondary | ICD-10-CM | POA: Diagnosis not present

## 2015-04-17 DIAGNOSIS — Z833 Family history of diabetes mellitus: Secondary | ICD-10-CM

## 2015-04-17 DIAGNOSIS — I10 Essential (primary) hypertension: Secondary | ICD-10-CM

## 2015-04-17 DIAGNOSIS — R937 Abnormal findings on diagnostic imaging of other parts of musculoskeletal system: Secondary | ICD-10-CM

## 2015-04-17 DIAGNOSIS — H3562 Retinal hemorrhage, left eye: Secondary | ICD-10-CM | POA: Diagnosis not present

## 2015-04-17 LAB — HM MAMMOGRAPHY

## 2015-04-17 MED ORDER — BENAZEPRIL HCL 20 MG PO TABS: 20.0000 mg | ORAL_TABLET | Freq: Every day | ORAL | Status: DC

## 2015-04-17 NOTE — Progress Notes (Signed)
Pre visit review using our clinic review tool, if applicable. No additional management support is needed unless otherwise documented below in the visit note. 

## 2015-04-17 NOTE — Patient Instructions (Signed)
Central Retinal Artery Occlusion Central retinal artery occlusion is when the artery that supplies blood to the retina is blocked. The retina is the layer of cells at the back of the eye that let us see light and color. These cells send light images to the brain by way of the optic nerve. The optic nerve connects directly to the brain from the back of the eye. If the central retinal artery is blocked, cells in the retina needed for vision die. A central retinal artery occlusion is a stroke of the retina and usually results in blindness of the affected eye. About 14% of people have a small extra artery (cilioretinal artery) coming off the central retinal artery and supplies blood to a key part of the retina. These people are very lucky if they have a central retinal artery occlusion because they may retain some vision. CAUSES  There are many different things that could cause the blockage such as:  Hardening of the arteries (arteriosclerosis).  High blood pressure.  High pressure in the eye from long occurring (chronic) or sudden (acute) glaucoma.  Swelling of the optic nerve. This can happen from inflammation of the nerve itself or from high pressure inside the head around the brain.  A small piece of fat traveling in the blood stream (fat embolus).  A blood clot in the artery.  A condition called arteritis (a response to injury or infection by an artery).  Migraine headache. This condition is more likely to happen in someone with:  Diabetes.  High blood pressure.  An abnormal heart rhythm.  Certain types of arthritis.  Certain blood conditions.  Certain heart valve problems.  Narrowing of the main arteries in the neck that supply blood to the brain (the carotid arteries). SYMPTOMS   Sudden, painless, total loss of vision in one eye. There are no symptoms before it happens.  If only a branch of this artery is blocked, there may be partial loss of vision coming from the area of  the retina that is not receiving blood. DIAGNOSIS  An ophthalmologist can usually diagnose this condition by examining the inside of the eye. Some of the usual findings of swelling only appear for a short time. By the time they go away, the retinal cells are damaged for good and cannot be restored.  TREATMENT  In very rare cases, sudden lowering of pressure in the eye by using medicines and actually draining some of the eye fluids can restore blood flow. This is rarely successful. Treatments that may be used are:  Medicines that lower the pressure inside the eye.  Medicines that can improve blood supply to the retina.  Oxygen with a small amount of carbon dioxide. It is important to get as much oxygen as possible to the retina. The carbon dioxide can help open blood vessels and let more oxygen and blood get to the retina. SEEK IMMEDIATE MEDICAL CARE IF:  You suddenly have a total loss of vision in one eye. It may not be possible to restore the vision that has been lost. However, it is important to know what the cause of the central retinal artery occlusion was so that any underlying disease can be treated right away. Document Released: 02/19/2001 Document Revised: 03/24/2014 Document Reviewed: 12/24/2007 Exit[employer] Patient Information 2015 [employer]. This information is not intended to replace advice given to you by your health care provider. Make sure you discuss any questions you have with your health care provider.  

## 2015-04-18 NOTE — Progress Notes (Signed)
Patient ID: Robin Obrien, female    DOB: [birth year]  Age: 64 y.o. 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 She is concerned because she was told she should be checked for DM, htn and cholesterol.    Review of Systems  Constitutional: Negative for activity change, appetite change, fatigue and unexpected weight change.  Respiratory: Negative for cough and shortness of breath.   Cardiovascular: Negative for chest pain and palpitations.  Psychiatric/Behavioral: Negative for behavioral problems and dysphoric mood. The patient is not nervous/anxious.     History Past Medical History  Diagnosis Date  . HYPOKALEMIA 03/18/2008  . ANXIETY 03/18/2008  . HYPERTENSION 03/18/2008  . ALLERGIC RHINITIS 03/18/2008  . GERD 03/18/2008  . Irritable bowel syndrome 01/01/2009  . CHOLELITHIASIS 03/18/2008  . RENAL CALCULUS, RIGHT 03/02/2010  . HEMATURIA UNSPECIFIED 03/01/2010  . 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 BACK PAIN 01/01/2009  . LEG CRAMPS 03/01/2010  . FLANK PAIN, RIGHT 03/01/2010  . LIVER FUNCTION TESTS, ABNORMAL 03/18/2008  . COLONIC POLYPS, HX OF 03/18/2008  . NEPHROLITHIASIS, HX OF 03/01/2010  . Preventative health care 04/03/2011  . Rectal fissure     She has past surgical history that includes Cholecystectomy (90's); cyst removed (1987); and fibroid tumor (1985).   Her family history includes Arthritis in her other; Breast cancer in her mother; Cancer in her father and other; Cancer (age of onset: 12) in her mother; Diabetes in her brother; Heart disease in her other; Hypertension in her other.She reports that she has quit smoking. She does not have any smokeless tobacco history on file. She reports that she drinks alcohol. She reports that she does not use illicit drugs.  Current Outpatient Prescriptions on File Prior to Visit  Medication Sig Dispense Refill  . aspirin 81 MG EC tablet Take 81 mg by mouth daily.      .  Cholecalciferol (VITAMIN D3) 1000 UNITS CAPS Take 2,000 Units by mouth daily.    . Glucosamine Sulfate (FP GLUCOSAMINE PO) Take 2,000 mg by mouth daily.    . meloxicam (MOBIC) 15 MG tablet Take 15 mg by mouth as needed for pain.    Marland Kitchen omeprazole (PRILOSEC) 20 MG capsule Take 1 capsule (20 mg total) by mouth daily. 30 capsule 3  . traMADol (ULTRAM) 50 MG tablet Take 1 tablet (50 mg total) by mouth every 6 (six) hours as needed. 30 tablet 1   No current facility-administered medications on file prior to visit.     Objective:  Objective Physical Exam  Constitutional: She is oriented to person, place, and time. She appears well-developed and well-nourished.  HENT:  Head: Normocephalic and atraumatic.  Eyes: Conjunctivae and EOM are normal.  Neck: Normal range of motion. Neck supple. No JVD present. Carotid bruit is not present. No thyromegaly present.  Cardiovascular: Normal rate, regular rhythm and normal heart sounds.   No murmur heard. Pulmonary/Chest: Effort normal and breath sounds normal. No respiratory distress. She has no wheezes. She has no rales. She exhibits no tenderness.  Musculoskeletal: She exhibits no edema.  Neurological: She is alert and oriented to person, place, and time.  Psychiatric: She has a normal mood and affect. Thought content normal.   BP 126/74 mmHg  Pulse 84  Temp(Src) 98.2 F (36.8 C) (Oral)  Resp 16  Ht 5\' 4"  (1.626 m)  Wt 155 lb (70.308 kg)  BMI 26.59 kg/m2  SpO2 99% Wt Readings from Last 3 Encounters:  04/17/15 155 lb (70.308 kg)  01/08/15 158 lb 3.2 oz (71.759 kg)  08/25/14 161 lb 4 oz (73.143 kg)     Lab Results  Component Value Date   WBC 5.1 06/01/2014   HGB 13.3 06/01/2014   HCT 39.1 06/01/2014   PLT 209 06/01/2014   GLUCOSE 113* 06/01/2014   CHOL 196 04/18/2014   TRIG 93.0 04/18/2014   HDL 51.70 04/18/2014   LDLCALC 126* 04/18/2014   ALT 18 06/01/2014   AST 23 06/01/2014   NA 145 06/01/2014   K 3.6* 06/01/2014   CL 105  06/01/2014   CREATININE 0.80 06/01/2014   BUN 26* 06/01/2014   CO2 23 06/01/2014   TSH 2.36 04/12/2013   INR 0.94 06/01/2014    No results found.   Assessment & Plan:  Plan I am having Ms. Shreiner maintain her aspirin, Glucosamine Sulfate (FP GLUCOSAMINE PO), Vitamin D3, meloxicam, omeprazole, traMADol, TURMERIC PO, Glucosamine-Chondroitin (MOVE FREE PO), and benazepril.  Meds ordered this encounter  Medications  . TURMERIC PO    Sig: Take by mouth.  . Glucosamine-Chondroitin (MOVE FREE PO)    Sig: Take by mouth.  . benazepril (LOTENSIN) 20 MG tablet    Sig: Take 1 tablet (20 mg total) by mouth daily.    Dispense:  90 tablet    Refill:  1    Problem List Items Addressed This Visit    Preventative health care   Relevant Orders   Ambulatory referral to Gastroenterology   Essential hypertension   Relevant Medications   benazepril (LOTENSIN) 20 MG tablet   Other Relevant Orders   Basic metabolic panel   Hepatic function panel   Lipid panel   Microalbumin / creatinine urine ratio   CBC with Differential/Platelet   POCT urinalysis dipstick    Other Visit Diagnoses    Breast cancer screening    -  Primary    Relevant Orders    MM Digital Screening    Abnormal bone density screening        Relevant Orders    DG Bone Density    Estrogen deficiency        Relevant Orders    DG Bone Density    Screening for breast cancer        Relevant Orders    MM Digital Screening    Breast cancer screening, high risk patient        Relevant Orders    MM Digital Screening    Family history of diabetes mellitus           Follow-up: Return in about 6 months (around 10/18/2015), or if symptoms worsen or fail to improve, for hypertension, hyperlipidemia, lipid, hep bmp, cbcd, ua, fasting.  Garnet Koyanagi, DO

## 2015-04-21 ENCOUNTER — Encounter: Payer: Self-pay | Admitting: Internal Medicine

## 2015-05-29 DIAGNOSIS — H3562 Retinal hemorrhage, left eye: Secondary | ICD-10-CM | POA: Diagnosis not present

## 2015-05-29 DIAGNOSIS — H34812 Central retinal vein occlusion, left eye: Secondary | ICD-10-CM | POA: Diagnosis not present

## 2015-06-09 ENCOUNTER — Encounter: Payer: Self-pay | Admitting: Internal Medicine

## 2015-06-14 DIAGNOSIS — R300 Dysuria: Secondary | ICD-10-CM | POA: Diagnosis not present

## 2015-06-14 DIAGNOSIS — N390 Urinary tract infection, site not specified: Secondary | ICD-10-CM | POA: Diagnosis not present

## 2015-06-15 ENCOUNTER — Encounter: Payer: Self-pay | Admitting: Family Medicine

## 2015-06-15 ENCOUNTER — Ambulatory Visit: Payer: Medicare Other | Admitting: Family Medicine

## 2015-06-15 ENCOUNTER — Other Ambulatory Visit: Payer: Self-pay

## 2015-06-15 ENCOUNTER — Other Ambulatory Visit (HOSPITAL_COMMUNITY)
Admission: RE | Admit: 2015-06-15 | Discharge: 2015-06-15 | Disposition: A | Discharge status: Home / Self Care | Payer: Medicare Other | Source: Ambulatory Visit | Attending: Family Medicine | Admitting: Family Medicine

## 2015-06-15 ENCOUNTER — Ambulatory Visit (INDEPENDENT_AMBULATORY_CARE_PROVIDER_SITE_OTHER): Payer: Medicare Other | Admitting: Family Medicine

## 2015-06-15 VITALS — BP 110/68 | HR 85 | Temp 99.1°F | Ht 64.0 in | Wt 151.2 lb

## 2015-06-15 DIAGNOSIS — N760 Acute vaginitis: Secondary | ICD-10-CM | POA: Diagnosis not present

## 2015-06-15 DIAGNOSIS — N23 Unspecified renal colic: Secondary | ICD-10-CM

## 2015-06-15 DIAGNOSIS — Z87442 Personal history of urinary calculi: Secondary | ICD-10-CM

## 2015-06-15 DIAGNOSIS — I10 Essential (primary) hypertension: Secondary | ICD-10-CM | POA: Diagnosis not present

## 2015-06-15 DIAGNOSIS — R300 Dysuria: Secondary | ICD-10-CM | POA: Diagnosis not present

## 2015-06-15 DIAGNOSIS — M791 Myalgia: Secondary | ICD-10-CM | POA: Diagnosis not present

## 2015-06-15 DIAGNOSIS — M609 Myositis, unspecified: Secondary | ICD-10-CM

## 2015-06-15 DIAGNOSIS — E559 Vitamin D deficiency, unspecified: Secondary | ICD-10-CM

## 2015-06-15 DIAGNOSIS — E2839 Other primary ovarian failure: Secondary | ICD-10-CM

## 2015-06-15 DIAGNOSIS — IMO0001 Reserved for inherently not codable concepts without codable children: Secondary | ICD-10-CM

## 2015-06-15 LAB — BASIC METABOLIC PANEL
BUN: 12 mg/dL (ref 6–23)
CO2: 27 mEq/L (ref 19–32)
CREATININE: 0.58 mg/dL (ref 0.40–1.20)
Calcium: 9.6 mg/dL (ref 8.4–10.5)
Chloride: 104 mEq/L (ref 96–112)
GFR: 133.54 mL/min (ref >60.00)
Glucose, Bld: 81 mg/dL (ref 70–99)
POTASSIUM: 3.5 meq/L (ref 3.5–5.1)
Sodium: 140 mEq/L (ref 135–145)

## 2015-06-15 LAB — HEPATIC FUNCTION PANEL
ALT: 22 U/L (ref 0–35)
AST: 19 U/L (ref 0–37)
Albumin: 4.4 g/dL (ref 3.5–5.2)
Alkaline Phosphatase: 86 U/L (ref 39–117)
BILIRUBIN DIRECT: 0.1 mg/dL (ref 0.0–0.3)
BILIRUBIN TOTAL: 0.5 mg/dL (ref 0.2–1.2)
Total Protein: 7.5 g/dL (ref 6.0–8.3)

## 2015-06-15 LAB — POCT URINALYSIS DIPSTICK
Bilirubin, UA: NEGATIVE
Glucose, UA: NEGATIVE
KETONES UA: NEGATIVE
NITRITE UA: NEGATIVE
SPEC GRAV UA: 1.015
UROBILINOGEN UA: 2
pH, UA: 6

## 2015-06-15 LAB — LIPID PANEL
CHOLESTEROL: 177 mg/dL (ref 0–200)
HDL: 57.1 mg/dL (ref >39.00)
LDL CALC: 96 mg/dL (ref 0–99)
NonHDL: 119.9
Total CHOL/HDL Ratio: 3
Triglycerides: 118 mg/dL (ref 0.0–149.0)
VLDL: 23.6 mg/dL (ref 0.0–40.0)

## 2015-06-15 LAB — CBC WITH DIFFERENTIAL/PLATELET
Basophils Absolute: 0 10*3/uL (ref 0.0–0.1)
Basophils Relative: 0.6 % (ref 0.0–3.0)
Eosinophils Absolute: 0.1 10*3/uL (ref 0.0–0.7)
Eosinophils Relative: 2.1 % (ref 0.0–5.0)
HCT: 38.3 % (ref 36.0–46.0)
Hemoglobin: 12.8 g/dL (ref 12.0–15.0)
LYMPHS ABS: 1.8 10*3/uL (ref 0.7–4.0)
LYMPHS PCT: 27.4 % (ref 12.0–46.0)
MCHC: 33.4 g/dL (ref 30.0–36.0)
MCV: 85.3 fl (ref 78.0–100.0)
Monocytes Absolute: 0.5 10*3/uL (ref 0.1–1.0)
Monocytes Relative: 8.1 % (ref 3.0–12.0)
NEUTROS PCT: 61.8 % (ref 43.0–77.0)
Neutro Abs: 4 10*3/uL (ref 1.4–7.7)
Platelets: 221 10*3/uL (ref 150.0–400.0)
RBC: 4.49 Mil/uL (ref 3.87–5.11)
RDW: 13.4 % (ref 11.5–15.5)
WBC: 6.5 10*3/uL (ref 4.0–10.5)

## 2015-06-15 LAB — MICROALBUMIN / CREATININE URINE RATIO
CREATININE, U: 63.6 mg/dL
Microalb Creat Ratio: 12.7 mg/g (ref 0.0–30.0)
Microalb, Ur: 8.1 mg/dL — ABNORMAL HIGH (ref 0.0–1.9)

## 2015-06-15 MED ORDER — HYDROCODONE-ACETAMINOPHEN 5-325 MG PO TABS: 1.0000 | ORAL_TABLET | Freq: Four times a day (QID) | ORAL | Status: DC | PRN

## 2015-06-15 MED ORDER — PHENAZOPYRIDINE HCL 200 MG PO TABS: 200.0000 mg | ORAL_TABLET | Freq: Three times a day (TID) | ORAL | Status: DC | PRN

## 2015-06-15 MED ORDER — CIPROFLOXACIN HCL 500 MG PO TABS: 500.0000 mg | ORAL_TABLET | Freq: Two times a day (BID) | ORAL | Status: DC

## 2015-06-15 NOTE — Progress Notes (Signed)
Patient ID: Robin Obrien, female    DOB: 10/16/1951  Age: 64 y.o. MRN: [id number]    Subjective:  Subjective HPI Robin Obrien presents for f/u UTI.  Pt was seen at [facility] for dysuria and dx with uti and given macrobid yesterday.  She has had 2 doses.  She also c/o L flank pain.  Low grade fever 99.1 yesterday.  And small amout vaginal d/c.   Review of Systems  Constitutional: Positive for fever. Negative for chills, activity change and appetite change.  Gastrointestinal: Negative for nausea, vomiting, abdominal pain, diarrhea, constipation, blood in stool and abdominal distention.  Genitourinary: Positive for dysuria, urgency, frequency, vaginal discharge and difficulty urinating. Negative for hematuria, flank pain, genital sores, vaginal pain, menstrual problem, pelvic pain and dyspareunia.  Musculoskeletal: Negative for back pain.  Psychiatric/Behavioral: Negative for dysphoric mood and decreased concentration. The patient is not nervous/anxious.     History Past Medical History  Diagnosis Date  . HYPOKALEMIA 03/18/2008  . ANXIETY 03/18/2008  . HYPERTENSION 03/18/2008  . ALLERGIC RHINITIS 03/18/2008  . GERD 03/18/2008  . Irritable bowel syndrome 01/01/2009  . CHOLELITHIASIS 03/18/2008  . RENAL CALCULUS, RIGHT 03/02/2010  . HEMATURIA UNSPECIFIED 03/01/2010  . 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 BACK PAIN 01/01/2009  . LEG CRAMPS 03/01/2010  . FLANK PAIN, RIGHT 03/01/2010  . LIVER FUNCTION TESTS, ABNORMAL 03/18/2008  . COLONIC POLYPS, HX OF 03/18/2008  . NEPHROLITHIASIS, HX OF 03/01/2010  . Preventative health care 04/03/2011  . Rectal fissure     She has past surgical history that includes Cholecystectomy (90's); cyst removed (1987); and fibroid tumor (1985).   Her family history includes Arthritis in her other; Breast cancer in her mother; Cancer in her father and other; Cancer (age of onset: 53) in her mother; Diabetes in her brother; Heart disease in her other; Hypertension in her  other.She reports that she has quit smoking. She does not have any smokeless tobacco history on file. She reports that she drinks alcohol. She reports that she does not use illicit drugs.  Current Outpatient Prescriptions on File Prior to Visit  Medication Sig Dispense Refill  . aspirin 81 MG EC tablet Take 81 mg by mouth daily.      . benazepril (LOTENSIN) 20 MG tablet Take 1 tablet (20 mg total) by mouth daily. 90 tablet 1  . Cholecalciferol (VITAMIN D3) 1000 UNITS CAPS Take 2,000 Units by mouth daily.    . Glucosamine Sulfate (FP GLUCOSAMINE PO) Take 2,000 mg by mouth daily.    . Glucosamine-Chondroitin (MOVE FREE PO) Take by mouth.    . meloxicam (MOBIC) 15 MG tablet Take 15 mg by mouth as needed for pain.    Marland Kitchen omeprazole (PRILOSEC) 20 MG capsule Take 1 capsule (20 mg total) by mouth daily. 30 capsule 3  . traMADol (ULTRAM) 50 MG tablet Take 1 tablet (50 mg total) by mouth every 6 (six) hours as needed. 30 tablet 1  . TURMERIC PO Take by mouth.     No current facility-administered medications on file prior to visit.     Objective:  Objective Physical Exam  Constitutional: She appears well-developed and well-nourished. No distress.  HENT:  Head: Normocephalic and atraumatic.  Nose: Nose normal.  TMs normal bilaterally No TTP over sinuses Bilateral tonsillar enlargement w/ erythema and exudate  Neck: Normal range of motion. Neck supple.  Cardiovascular: Normal rate, regular rhythm and normal heart sounds.   Pulmonary/Chest: Effort normal and breath sounds normal. No respiratory distress. She has no  wheezes. She has no rales.  Lymphadenopathy:    She has cervical adenopathy.  Vitals reviewed.  BP 110/68 mmHg  Pulse 85  Temp(Src) 99.1 F (37.3 C) (Oral)  Ht 5\' 4"  (1.626 m)  Wt 151 lb 3.2 oz (68.584 kg)  BMI 25.94 kg/m2  SpO2 99% Wt Readings from Last 3 Encounters:  06/15/15 151 lb 3.2 oz (68.584 kg)  04/17/15 155 lb (70.308 kg)  01/08/15 158 lb 3.2 oz (71.759 kg)      Lab Results  Component Value Date   WBC 6.5 06/15/2015   HGB 12.8 06/15/2015   HCT 38.3 06/15/2015   PLT 221.0 06/15/2015   GLUCOSE 81 06/15/2015   CHOL 177 06/15/2015   TRIG 118.0 06/15/2015   HDL 57.10 06/15/2015   LDLCALC 96 06/15/2015   ALT 22 06/15/2015   AST 19 06/15/2015   NA 140 06/15/2015   K 3.5 06/15/2015   CL 104 06/15/2015   CREATININE 0.58 06/15/2015   BUN 12 06/15/2015   CO2 27 06/15/2015   TSH 2.36 04/12/2013   INR 0.94 06/01/2014   MICROALBUR 8.1* 06/15/2015   No results found.   Assessment & Plan:  Plan I am having Ms. Sylvan start on phenazopyridine, ciprofloxacin, and HYDROcodone-acetaminophen. I am also having her maintain her aspirin, Glucosamine Sulfate (FP GLUCOSAMINE PO), Vitamin D3, meloxicam, omeprazole, traMADol, TURMERIC PO, Glucosamine-Chondroitin (MOVE FREE PO), benazepril, and nitrofurantoin (macrocrystal-monohydrate).  Meds ordered this encounter  Medications  . nitrofurantoin, macrocrystal-monohydrate, (MACROBID) 100 MG capsule    Sig: Take 100 mg by mouth 2 (two) times daily.  . phenazopyridine (PYRIDIUM) 200 MG tablet    Sig: Take 1 tablet (200 mg total) by mouth 3 (three) times daily as needed for pain.    Dispense:  6 tablet    Refill:  0  . ciprofloxacin (CIPRO) 500 MG tablet    Sig: Take 1 tablet (500 mg total) by mouth 2 (two) times daily.    Dispense:  10 tablet    Refill:  0  . HYDROcodone-acetaminophen (NORCO/VICODIN) 5-325 MG per tablet    Sig: Take 1 tablet by mouth every 6 (six) hours as needed for moderate pain.    Dispense:  30 tablet    Refill:  0    Problem List Items Addressed This Visit    NEPHROLITHIASIS, HX OF    Strain urine  Pain meds prn Will get CT if pain worsens.  It pt takes pain med and pain con't --- go to ER      Essential hypertension    Other Visit Diagnoses    Dysuria    -  Primary    Relevant Medications    phenazopyridine (PYRIDIUM) 200 MG tablet    ciprofloxacin (CIPRO) 500 MG  tablet    Other Relevant Orders    POCT Urinalysis Dipstick (Completed)    Urine Culture    Renal colic        Relevant Medications    HYDROcodone-acetaminophen (NORCO/VICODIN) 5-325 MG per tablet    Myalgia and myositis        Relevant Orders    Vitamin D 1,25 dihydroxy    Magnesium    Estrogen deficiency        Relevant Orders    Vitamin D 1,25 dihydroxy    Magnesium    Vitamin D deficiency        Relevant Orders    Vitamin D 1,25 dihydroxy       Follow-up: Return if symptoms worsen or fail  to improve.  Garnet Koyanagi, DO

## 2015-06-15 NOTE — Progress Notes (Signed)
Pre visit review using our clinic review tool, if applicable. No additional management support is needed unless otherwise documented below in the visit note. 

## 2015-06-15 NOTE — Patient Instructions (Signed)

## 2015-06-15 NOTE — Assessment & Plan Note (Signed)
Strain urine  Pain meds prn Will get CT if pain worsens.  It pt takes pain med and pain con't --- go to ER

## 2015-06-16 LAB — URINE CULTURE: Colony Count: 3000

## 2015-06-16 LAB — MAGNESIUM: Magnesium: 2 mg/dL (ref 1.5–2.5)

## 2015-06-17 LAB — VITAMIN D 1,25 DIHYDROXY
Vitamin D 1, 25 (OH)2 Total: 70 pg/mL (ref 18–72)
Vitamin D2 1, 25 (OH)2: <8 pg/mL
Vitamin D3 1, 25 (OH)2: 70 pg/mL

## 2015-06-17 LAB — URINE CYTOLOGY ANCILLARY ONLY: Trichomonas: NEGATIVE

## 2015-06-18 LAB — URINE CYTOLOGY ANCILLARY ONLY
Bacterial vaginitis: NEGATIVE
Candida vaginitis: NEGATIVE

## 2015-06-22 ENCOUNTER — Encounter: Payer: Medicare Other | Admitting: Internal Medicine

## 2015-06-24 ENCOUNTER — Encounter: Payer: Medicare Other | Admitting: Internal Medicine

## 2015-06-25 ENCOUNTER — Encounter: Payer: Self-pay | Admitting: *Deleted

## 2015-07-22 ENCOUNTER — Telehealth: Payer: Self-pay

## 2015-07-22 NOTE — Telephone Encounter (Signed)
Yes, proceed to colonoscopy, which was recommended last Oct after oV

## 2015-07-22 NOTE — Telephone Encounter (Signed)
Pt has 2003 report in epic.  I can not find 2009 report where colon polyps are mentioned.  You saw her in 2015 and listed the polyps under problem list.  Do I have to have copy of this report in order to proceed as planned or can we keep her colon appt for now and just get release signed at [facility]?  Thank you, Angela/PV

## 2015-07-23 ENCOUNTER — Ambulatory Visit (AMBULATORY_SURGERY_CENTER): Payer: Self-pay

## 2015-07-23 VITALS — Ht 64.5 in | Wt 151.4 lb

## 2015-07-23 DIAGNOSIS — Z860100 Personal history of colon polyps, unspecified: Secondary | ICD-10-CM

## 2015-07-23 NOTE — Progress Notes (Signed)
No allergies to eggs or soy No past problems with anesthesia No home oxygen No diet/weight loss meds  Refused emmi 

## 2015-08-06 ENCOUNTER — Telehealth: Payer: Self-pay | Admitting: *Deleted

## 2015-08-06 ENCOUNTER — Ambulatory Visit (AMBULATORY_SURGERY_CENTER): Payer: Medicare Other | Admitting: Internal Medicine

## 2015-08-06 ENCOUNTER — Encounter: Payer: Self-pay | Admitting: Internal Medicine

## 2015-08-06 VITALS — BP 106/77 | HR 65 | Temp 97.8°F | Resp 20 | Ht 64.5 in | Wt 151.0 lb

## 2015-08-06 DIAGNOSIS — I10 Essential (primary) hypertension: Secondary | ICD-10-CM | POA: Diagnosis not present

## 2015-08-06 DIAGNOSIS — D124 Benign neoplasm of descending colon: Secondary | ICD-10-CM

## 2015-08-06 DIAGNOSIS — D120 Benign neoplasm of cecum: Secondary | ICD-10-CM | POA: Diagnosis not present

## 2015-08-06 DIAGNOSIS — D123 Benign neoplasm of transverse colon: Secondary | ICD-10-CM

## 2015-08-06 DIAGNOSIS — D125 Benign neoplasm of sigmoid colon: Secondary | ICD-10-CM

## 2015-08-06 DIAGNOSIS — Z86010 Personal history of colonic polyps: Secondary | ICD-10-CM

## 2015-08-06 MED ORDER — SODIUM CHLORIDE 0.9 % IV SOLN: 500.0000 mL | INTRAVENOUS | Status: DC

## 2015-08-06 NOTE — Progress Notes (Signed)
Called to room to assist during endoscopic procedure.  Patient ID and intended procedure confirmed with present staff. Received instructions for my participation in the procedure from the performing physician.  

## 2015-08-06 NOTE — Telephone Encounter (Signed)
-----   Message from Jerene Bears, MD sent at 08/06/2015  2:12 PM EDT ----- Regarding: banding Wants banding Please set up x 3 and let her know We discussed after colonsocopy today jmp

## 2015-08-06 NOTE — Patient Instructions (Signed)
YOU HAD AN ENDOSCOPIC PROCEDURE TODAY AT THE  ENDOSCOPY CENTER:   Refer to the procedure report that was given to you for any specific questions about what was found during the examination.  If the procedure report does not answer your questions, please call your gastroenterologist to clarify.  If you requested that your care partner not be given the details of your procedure findings, then the procedure report has been included in a sealed envelope for you to review at your convenience later.  YOU SHOULD EXPECT: Some feelings of bloating in the abdomen. Passage of more gas than usual.  Walking can help get rid of the air that was put into your GI tract during the procedure and reduce the bloating. If you had a lower endoscopy (such as a colonoscopy or flexible sigmoidoscopy) you may notice spotting of blood in your stool or on the toilet paper. If you underwent a bowel prep for your procedure, you may not have a normal bowel movement for a few days.  Please Note:  You might notice some irritation and congestion in your nose or some drainage.  This is from the oxygen used during your procedure.  There is no need for concern and it should clear up in a day or so.  SYMPTOMS TO REPORT IMMEDIATELY:   Following lower endoscopy (colonoscopy or flexible sigmoidoscopy):  Excessive amounts of blood in the stool  Significant tenderness or worsening of abdominal pains  Swelling of the abdomen that is new, acute  Fever of 100F or higher   For urgent or emergent issues, a gastroenterologist can be reached at any hour by calling [phone].   DIET: Your first meal following the procedure should be a small meal and then it is ok to progress to your normal diet. Heavy or fried foods are harder to digest and may make you feel nauseous or bloated.  Likewise, meals heavy in dairy and vegetables can increase bloating.  Drink plenty of fluids but you should avoid alcoholic beverages for 24  hours.  ACTIVITY:  You should plan to take it easy for the rest of today and you should NOT DRIVE or use heavy machinery until tomorrow (because of the sedation medicines used during the test).    FOLLOW UP: Our staff will call the number listed on your records the next business day following your procedure to check on you and address any questions or concerns that you may have regarding the information given to you following your procedure. If we do not reach you, we will leave a message.  However, if you are feeling well and you are not experiencing any problems, there is no need to return our call.  We will assume that you have returned to your regular daily activities without incident.  If any biopsies were taken you will be contacted by phone or by letter within the next 1-3 weeks.  Please call us at [phone] if you have not heard about the biopsies in 3 weeks.    SIGNATURES/CONFIDENTIALITY: You and/or your care partner have signed paperwork which will be entered into your electronic medical record.  These signatures attest to the fact that that the information above on your After Visit Summary has been reviewed and is understood.  Full responsibility of the confidentiality of this discharge information lies with you and/or your care-partner. 

## 2015-08-06 NOTE — Progress Notes (Signed)
Transferred to recovery room. A/O x3, pleased with MAC.  VSS.  Report to Karen, RN. 

## 2015-08-06 NOTE — Op Note (Signed)
[place]  [address]. [place], [address]   COLONOSCOPY PROCEDURE REPORT  PATIENT: Robin, Obrien  MR#: [id number] BIRTHDATE: 20-Aug-1951 , 50  yrs. old GENDER: female ENDOSCOPIST: Jerene Bears, MD REFERRED FY:BOFBPZ [place], DO PROCEDURE DATE:  08/06/2015 PROCEDURE:   Colonoscopy, surveillance and Colonoscopy with snare polypectomy First Screening Colonoscopy - Avg.  risk and is 50 yrs.  old or older - No.  Prior Negative Screening - Now for repeat screening. N/A  History of Adenoma - Now for follow-up colonoscopy & has been > or = to 3 yrs.  Yes hx of adenoma.  Has been 3 or more years since last colonoscopy.  Polyps removed today? Yes ASA CLASS:   Class II INDICATIONS:Surveillance due to prior colonic neoplasia and PH Colon Adenoma, last colonoscopy 2003 [facility] MEDICATIONS: Monitored anesthesia care and Propofol 250 mg IV  DESCRIPTION OF PROCEDURE:   After the risks benefits and alternatives of the procedure were thoroughly explained, informed consent was obtained.  The digital rectal exam revealed no rectal mass.   The LB [id number]  endoscope was introduced through the anus and advanced to the cecum, which was identified by both the appendix and ileocecal valve. No adverse events experienced. The quality of the prep was good.  (Suprep was used)  The instrument was then slowly withdrawn as the colon was fully examined. Estimated blood loss is zero unless otherwise noted in this procedure report.   COLON FINDINGS: Three sessile polyps ranging between 3-90mm in size were found at the cecum, in the transverse colon, and descending colon.  Polypectomies were performed with a cold snare.  The resection was complete, the polyp tissue was completely retrieved and sent to histology.   A pedunculated polyp measuring 8 mm in size was found in the distal sigmoid colon.  A polypectomy was performed using snare cautery.  The resection was complete, the polyp  tissue was completely retrieved and sent to histology. There was moderate diverticulosis noted in the descending colon and sigmoid colon.  Retroflexed views revealed internal hemorrhoids. The time to cecum = 2.3 Withdrawal time = 12.9   The scope was withdrawn and the procedure completed.  COMPLICATIONS: There were no immediate complications.    ENDOSCOPIC IMPRESSION: 1.   Three sessile polyps ranging between 3-21mm in size were found at the cecum, in the transverse colon, and descending colon; polypectomies were performed with a cold snare 2.   Pedunculated polyp was found in the distal sigmoid colon; polypectomy was performed using snare cautery 3.   Moderate diverticulosis was noted in the descending colon and sigmoid colon  RECOMMENDATIONS: 1.  Await pathology results 2.  High fiber diet 3.  If the polyps removed today are proven to be adenomatous (pre-cancerous) polyps, you will need a colonoscopy in 3 years. Otherwise you should continue to follow colorectal cancer screening guidelines for "routine risk" patients with a colonoscopy in 10 years.  You will receive a letter within 1-2 weeks with the results of your biopsy as well as final recommendations.  Please call my office if you have not received a letter after 3 weeks.  eSigned:  Jerene Bears, MD 08/06/2015 1:57 PM   cc: Rosalita Chessman, DO and The Patient   PATIENT NAME:  Robin, Obrien MR#: [id number]

## 2015-08-06 NOTE — Telephone Encounter (Signed)
Patient has been scheduled for hemorrhoidal banding 1, 2 and 3 and has been advised of times and dates of these. She verbalizes understanding.

## 2015-08-07 ENCOUNTER — Telehealth: Payer: Self-pay | Admitting: *Deleted

## 2015-08-07 NOTE — Telephone Encounter (Signed)
  Follow up Call-  Call back number 08/06/2015  Post procedure Call Back phone  # [phone]  Permission to leave phone message Yes     Patient questions:  Do you have a fever, pain , or abdominal swelling? No. Pain Score  0 *  Have you tolerated food without any problems? Yes.    Have you been able to return to your normal activities? Yes.    Do you have any questions about your discharge instructions: Diet   No. Medications  No. Follow up visit  No.  Do you have questions or concerns about your Care? No.  Actions: * If pain score is 4 or above: No action needed, pain <4.

## 2015-08-13 ENCOUNTER — Encounter: Payer: Self-pay | Admitting: *Deleted

## 2015-08-13 ENCOUNTER — Other Ambulatory Visit: Payer: Medicare Other

## 2015-08-13 DIAGNOSIS — I10 Essential (primary) hypertension: Secondary | ICD-10-CM | POA: Diagnosis not present

## 2015-08-14 LAB — MICROALBUMIN, URINE, 24 HOUR
Microalb, 24H Ur: 53.3 mg/d — ABNORMAL HIGH (ref 0.0–30.0)
Microalb, Ur: 4.1 mg/dL — ABNORMAL HIGH

## 2015-08-18 ENCOUNTER — Encounter: Payer: Self-pay | Admitting: Internal Medicine

## 2015-08-19 ENCOUNTER — Telehealth: Payer: Self-pay | Admitting: Family Medicine

## 2015-08-19 ENCOUNTER — Telehealth: Payer: Self-pay | Admitting: Internal Medicine

## 2015-08-19 ENCOUNTER — Encounter: Payer: Self-pay | Admitting: Family Medicine

## 2015-08-19 NOTE — Telephone Encounter (Signed)
Called lab results from Urine Albumin was from patients 24 hour urine. Patient notified.Mailed patient copy of labs and explanation of Albumin.

## 2015-08-19 NOTE — Progress Notes (Signed)
Called patient to let her know that the call she received regarding results was based off 24 hour urine she brought in last week per lab. Patient would like to know if there is anything she should/could do to improve her results.

## 2015-08-19 NOTE — Telephone Encounter (Signed)
Pt returning call for lab results. Best # [phone].

## 2015-08-19 NOTE — Telephone Encounter (Signed)
Called patient with results of Albumin. States she turned in 24 hour urine last week.

## 2015-08-19 NOTE — Telephone Encounter (Signed)
Pathology result discussed with pt and she knows recall in 3 years.

## 2015-08-19 NOTE — Telephone Encounter (Signed)
Patient inquiring about urine results. Best # [phone]

## 2015-08-20 ENCOUNTER — Telehealth: Payer: Self-pay

## 2015-08-20 NOTE — Telephone Encounter (Signed)
noted 

## 2015-08-20 NOTE — Progress Notes (Signed)
Could stop mobic to see if that is contributing bp control is important And we will f/u---- if it does not improve we will refer to nephrology

## 2015-08-20 NOTE — Telephone Encounter (Signed)
Left message for patient to call me back regarding suggestions made by Dr. Etter Sjogren.

## 2015-08-20 NOTE — Telephone Encounter (Signed)
Called patient wit suggestions from Dr. Etter Sjogren. Patient states she will think about referral and call back.

## 2015-08-21 ENCOUNTER — Telehealth: Payer: Self-pay | Admitting: Internal Medicine

## 2015-08-24 NOTE — Telephone Encounter (Signed)
Left voicemail for patient to call back. She is actually scheduled for 2 additional hemorrhoidal bandings already. We can change one of those to banding #1 if needed.

## 2015-08-31 ENCOUNTER — Encounter: Payer: Medicare Other | Admitting: Internal Medicine

## 2015-09-04 DIAGNOSIS — H3562 Retinal hemorrhage, left eye: Secondary | ICD-10-CM | POA: Diagnosis not present

## 2015-09-04 DIAGNOSIS — H34812 Central retinal vein occlusion, left eye, with macular edema: Secondary | ICD-10-CM | POA: Diagnosis not present

## 2015-09-10 ENCOUNTER — Telehealth: Payer: Self-pay | Admitting: Family Medicine

## 2015-09-10 DIAGNOSIS — N23 Unspecified renal colic: Secondary | ICD-10-CM

## 2015-09-10 NOTE — Telephone Encounter (Signed)
Ok to refill x1 Ok to give new handicap sticker

## 2015-09-10 NOTE — Telephone Encounter (Signed)
Caller name: Daphanie Oquendo   Relationship to patient: Self   Can be reached: [phone]   Reason for call: Pt is requesting a refill on her HYDROcodone Rx.   ALSO, pt says that her handicap sticker has expired, she want to know if she can get a new one.

## 2015-09-10 NOTE — Telephone Encounter (Signed)
Last seen and filled  06/15/15 #30.    Please advise     KP

## 2015-09-11 MED ORDER — HYDROCODONE-ACETAMINOPHEN 5-325 MG PO TABS: 1.0000 | ORAL_TABLET | Freq: Four times a day (QID) | ORAL | Status: DC | PRN

## 2015-09-11 NOTE — Telephone Encounter (Signed)
Patient is aware that the Rx and application are both ready for pick up.    KP

## 2015-09-15 ENCOUNTER — Encounter: Payer: Self-pay | Admitting: Internal Medicine

## 2015-09-15 ENCOUNTER — Ambulatory Visit (INDEPENDENT_AMBULATORY_CARE_PROVIDER_SITE_OTHER): Payer: Medicare Other | Admitting: Internal Medicine

## 2015-09-15 VITALS — BP 120/80 | HR 80 | Ht 63.75 in | Wt 149.2 lb

## 2015-09-15 DIAGNOSIS — K648 Other hemorrhoids: Secondary | ICD-10-CM | POA: Diagnosis not present

## 2015-09-15 DIAGNOSIS — K589 Irritable bowel syndrome without diarrhea: Secondary | ICD-10-CM

## 2015-09-15 MED ORDER — HYOSCYAMINE SULFATE 0.125 MG SL SUBL: SUBLINGUAL_TABLET | SUBLINGUAL | Status: DC

## 2015-09-15 NOTE — Patient Instructions (Signed)
We have cancelled your future hemorrhoidal banding appointments per your request. Please call back if you change your mind about this.  We have sent the following medications to your pharmacy for you to pick up at your convenience: Levsin SL- 1-2 tablets every 6 hours as needed for IBS  Please follow up as needed.

## 2015-09-15 NOTE — Progress Notes (Signed)
Patient ID: Robin Obrien, female   DOB: [birth year], 64 y.o.   MRN: [id number]  HPI: [name] is a 64 year old female with a past medical history of adenomatous colon polyps, diverticulosis, internal hemorrhoids who presented today for consideration of hemorrhoidal banding. She has had an episode of rectal bleeding last year which she states she feels was secondary to mobile. She's had no recent rectal bleeding. She does have some intermittent crampy type abdominal discomfort which feels almost "like butterflies". This tends to be driven by stress and she feels as irritable bowel related. She denies true abdominal pain. No nausea or vomiting. Hemorrhoidal symptoms for her currently are related to mild prolapse and the feeling that she cannot adequately clean after bowel movement. She reports she is always nervous that she will not be clean enough and may have a foul odor. There is been no recent rectal bleeding.  Past Medical History  Diagnosis Date  . HYPOKALEMIA 03/18/2008  . ANXIETY 03/18/2008  . HYPERTENSION 03/18/2008  . ALLERGIC RHINITIS 03/18/2008  . GERD 03/18/2008  . Irritable bowel syndrome 01/01/2009  . CHOLELITHIASIS 03/18/2008  . RENAL CALCULUS, RIGHT 03/02/2010  . HEMATURIA UNSPECIFIED 03/01/2010  . 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 BACK PAIN 01/01/2009  . LEG CRAMPS 03/01/2010  . FLANK PAIN, RIGHT 03/01/2010  . LIVER FUNCTION TESTS, ABNORMAL 03/18/2008  . COLONIC POLYPS, HX OF 03/18/2008  . NEPHROLITHIASIS, HX OF 03/01/2010  . Preventative health care 04/03/2011  . Rectal fissure   . Tubular adenoma of colon     Past Surgical History  Procedure Laterality Date  . Cholecystectomy  90's  . Cyst removed  1987    removed from left breast  . Fibroid tumor  1985    Outpatient Prescriptions Prior to Visit  Medication Sig Dispense Refill  . aspirin 81 MG EC tablet Take 81 mg by mouth daily.      . benazepril (LOTENSIN) 20 MG tablet Take 1 tablet (20 mg total) by mouth daily. 90  tablet 1  . Cholecalciferol (VITAMIN D3) 1000 UNITS CAPS Take 2,000 Units by mouth daily.    Marland Kitchen HYDROcodone-acetaminophen (NORCO/VICODIN) 5-325 MG tablet Take 1 tablet by mouth every 6 (six) hours as needed for moderate pain. 30 tablet 0  . Glucosamine Sulfate (FP GLUCOSAMINE PO) Take 2,000 mg by mouth daily.    . Glucosamine-Chondroitin (MOVE FREE PO) Take by mouth.    . meloxicam (MOBIC) 15 MG tablet Take 15 mg by mouth as needed for pain.     No facility-administered medications prior to visit.    Allergies  Allergen Reactions  . Lactose Intolerance (Gi)   . Mobic [Meloxicam] Other (See Comments)    Pt has had issues with bleeding and never wants to take it again    Family History  Problem Relation Age of Onset  . Diabetes Brother   . Arthritis Other   . Cancer Other     breast and lung cancer  . Heart disease Other   . Hypertension Other   . Breast cancer Mother   . Cancer Mother 59    lung, breast  . Cancer Father   . Colon cancer Neg Hx   . Colon polyps Neg Hx     Social History  Substance Use Topics  . Smoking status: Former Research scientist (life sciences)  . Smokeless tobacco: Never Used  . Alcohol Use: 1.8 oz/week    3 Glasses of wine per week     Comment: occ    ROS: As per  history of present illness, otherwise negative  BP 120/80 mmHg  Pulse 80  Ht 5' 3.75" (1.619 m)  Wt 149 lb 4 oz (67.699 kg)  BMI 25.83 kg/m2 Constitutional: Well-developed and well-nourished. No distress. HEENT: Normocephal ic and atraumatic.  Conjunctivae are normal.  No scleral icterus. Abdominal: Soft, nontender, nondistended. Bowel sounds active throughout.  Extremities: no clubbing, cyanosis, or edema Neurological: Alert and oriented to person place and time. Psychiatric: Normal mood and affect. Behavior is normal.  RELEVANT LABS AND IMAGING: CBC    Component Value Date/Time   WBC 6.5 06/15/2015 1035   RBC 4.49 06/15/2015 1035   HGB 12.8 06/15/2015 1035   HCT 38.3 06/15/2015 1035   PLT 221.0  06/15/2015 1035   MCV 85.3 06/15/2015 1035   MCH 28.4 06/01/2014 1900   MCHC 33.4 06/15/2015 1035   RDW 13.4 06/15/2015 1035   LYMPHSABS 1.8 06/15/2015 1035   MONOABS 0.5 06/15/2015 1035   EOSABS 0.1 06/15/2015 1035   BASOSABS 0.0 06/15/2015 1035    CMP     Component Value Date/Time   NA 140 06/15/2015 1035   K 3.5 06/15/2015 1035   CL 104 06/15/2015 1035   CO2 27 06/15/2015 1035   GLUCOSE 81 06/15/2015 1035   BUN 12 06/15/2015 1035   CREATININE 0.58 06/15/2015 1035   CALCIUM 9.6 06/15/2015 1035   PROT 7.5 06/15/2015 1035   ALBUMIN 4.4 06/15/2015 1035   AST 19 06/15/2015 1035   ALT 22 06/15/2015 1035   ALKPHOS 86 06/15/2015 1035   BILITOT 0.5 06/15/2015 1035   GFRNONAA 76* 06/01/2014 1900   GFRAA 88* 06/01/2014 1900    ASSESSMENT/PLAN: 64 year old female with a past medical history of adenomatous colon polyps, diverticulosis, internal hemorrhoids who presented today for consideration of hemorrhoidal banding.  1. Internal hemorrhoids with prolapse -- we discussed the risk and benefits of hemorrhoidal banding today. Her issue is primarily with perianal hygiene after bowel movement. I let her know that she does not need to spend an over abundance of time cleaning herself after bowel movement. This can often irritate tissue more. We discussed hemorrhoidal banding and how I do think it would benefit the prolapse symptoms but she is hesitant to proceed. She would like to think more about this. She can call back to reschedule banding appointment if interested. Currently there is no bleeding or fecal seepage.  2. IBS -- mild. When necessary Levsin 0.125 mg 1-2 tablets every 6 hours on an as-needed basis  3. Adenomatous colon polyps -- repeat colonoscopy in 3 years  Return as needed  FE:OFHQR[address], [address], [place] [address]

## 2015-09-29 ENCOUNTER — Encounter: Payer: Medicare Other | Admitting: Internal Medicine

## 2015-10-02 ENCOUNTER — Telehealth: Payer: Self-pay

## 2015-10-02 NOTE — Telephone Encounter (Signed)
Pre Visit call completed. 

## 2015-10-05 ENCOUNTER — Encounter: Payer: Medicare Other | Admitting: Family Medicine

## 2015-10-05 ENCOUNTER — Telehealth: Payer: Self-pay | Admitting: Family Medicine

## 2015-10-05 NOTE — Telephone Encounter (Signed)
Pt called at 10:22am running late due to "gastro issues" and asked if she could come in late, Per Maudie Mercury advised her that we would have to reschedule, charge or no charge?

## 2015-10-05 NOTE — Telephone Encounter (Signed)
no

## 2015-10-19 ENCOUNTER — Encounter: Payer: Medicare Other | Admitting: Internal Medicine

## 2015-11-06 ENCOUNTER — Encounter: Payer: Medicare Other | Admitting: Family Medicine

## 2016-01-11 ENCOUNTER — Encounter: Payer: Self-pay | Admitting: Family Medicine

## 2016-01-11 ENCOUNTER — Ambulatory Visit (INDEPENDENT_AMBULATORY_CARE_PROVIDER_SITE_OTHER): Payer: Medicare Other | Admitting: Family Medicine

## 2016-01-11 VITALS — BP 130/74 | HR 57 | Temp 98.1°F | Ht 64.0 in | Wt 147.0 lb

## 2016-01-11 DIAGNOSIS — E2839 Other primary ovarian failure: Secondary | ICD-10-CM

## 2016-01-11 DIAGNOSIS — Z1159 Encounter for screening for other viral diseases: Secondary | ICD-10-CM

## 2016-01-11 DIAGNOSIS — M25552 Pain in left hip: Secondary | ICD-10-CM | POA: Diagnosis not present

## 2016-01-11 DIAGNOSIS — M25551 Pain in right hip: Secondary | ICD-10-CM | POA: Diagnosis not present

## 2016-01-11 DIAGNOSIS — M160 Bilateral primary osteoarthritis of hip: Secondary | ICD-10-CM | POA: Diagnosis not present

## 2016-01-11 DIAGNOSIS — N23 Unspecified renal colic: Secondary | ICD-10-CM | POA: Diagnosis not present

## 2016-01-11 DIAGNOSIS — Z0000 Encounter for general adult medical examination without abnormal findings: Secondary | ICD-10-CM

## 2016-01-11 DIAGNOSIS — Z1231 Encounter for screening mammogram for malignant neoplasm of breast: Secondary | ICD-10-CM | POA: Diagnosis not present

## 2016-01-11 DIAGNOSIS — I10 Essential (primary) hypertension: Secondary | ICD-10-CM

## 2016-01-11 MED ORDER — BENAZEPRIL HCL 20 MG PO TABS: 20.0000 mg | ORAL_TABLET | Freq: Every day | ORAL | Status: DC

## 2016-01-11 MED ORDER — HYDROCODONE-ACETAMINOPHEN 5-325 MG PO TABS: 1.0000 | ORAL_TABLET | Freq: Four times a day (QID) | ORAL | Status: DC | PRN

## 2016-01-11 NOTE — Patient Instructions (Addendum)
Please schedule your mammogram and Bone Density Scan at the [facility] Address: [address], [place], [place] [address]  Phone: [phone]  Hours:   Monday  (President's Day) 7AM-6:30PM  Hours might differ  Tuesday 7AM-5PM  Wednesday 7AM-5PM  Thursday 7AM-5PM  Friday 7AM-5PM  Saturday Closed  Sunday Closed   Preventive Care for Adults, Female A healthy lifestyle and preventive care can promote health and wellness. Preventive health guidelines for women include the following key practices.  A routine yearly physical is a good way to check with your health care provider about your health and preventive screening. It is a chance to share any concerns and updates on your health and to receive a thorough exam.  Visit your dentist for a routine exam and preventive care every 6 months. Brush your teeth twice a day and floss once a day. Good oral hygiene prevents tooth decay and gum disease.  The frequency of eye exams is based on your age, health, family medical history, use of contact lenses, and other factors. Follow your health care provider's recommendations for frequency of eye exams.  Eat a healthy diet. Foods like vegetables, fruits, whole grains, low-fat dairy products, and lean protein foods contain the nutrients you need without too many calories. Decrease your intake of foods high in solid fats, added sugars, and salt. Eat the right amount of calories for you.Get information about a proper diet from your health care provider, if necessary.  Regular physical exercise is one of the most important things you can do for your health. Most adults should get at least 150 minutes of moderate-intensity exercise (any activity that increases your heart rate and causes you to sweat) each week. In addition, most adults need muscle-strengthening exercises on 2 or more days a week.  Maintain a healthy weight. The body mass index (BMI) is a screening tool to identify  possible weight problems. It provides an estimate of body fat based on height and weight. Your health care provider can find your BMI and can help you achieve or maintain a healthy weight.For adults 20 years and older:  A BMI below 18.5 is considered underweight.  A BMI of 18.5 to 24.9 is normal.  A BMI of 25 to 29.9 is considered overweight.  A BMI of 30 and above is considered obese.  Maintain normal blood lipids and cholesterol levels by exercising and minimizing your intake of saturated fat. Eat a balanced diet with plenty of fruit and vegetables. Blood tests for lipids and cholesterol should begin at age 24 and be repeated every 5 years. If your lipid or cholesterol levels are high, you are over 50, or you are at high risk for heart disease, you may need your cholesterol levels checked more frequently.Ongoing high lipid and cholesterol levels should be treated with medicines if diet and exercise are not working.  If you smoke, find out from your health care provider how to quit. If you do not use tobacco, do not start.  Lung cancer screening is recommended for adults aged 55-80 years who are at high risk for developing lung cancer because of a history of smoking. A yearly low-dose CT scan of the lungs is recommended for people who have at least a 30-pack-year history of smoking and are a current smoker or have quit within the past 15 years. A pack year of smoking is smoking an average of 1 pack of cigarettes a day for 1 year (for example: 1 pack a  day for 30 years or 2 packs a day for 15 years). Yearly screening should continue until the smoker has stopped smoking for at least 15 years. Yearly screening should be stopped for people who develop a health problem that would prevent them from having lung cancer treatment.  If you are pregnant, do not drink alcohol. If you are breastfeeding, be very cautious about drinking alcohol. If you are not pregnant and choose to drink alcohol, do not have  more than 1 drink per day. One drink is considered to be 12 ounces (355 mL) of beer, 5 ounces (148 mL) of wine, or 1.5 ounces (44 mL) of liquor.  Avoid use of street drugs. Do not share needles with anyone. Ask for help if you need support or instructions about stopping the use of drugs.  High blood pressure causes heart disease and increases the risk of stroke. Your blood pressure should be checked at least every 1 to 2 years. Ongoing high blood pressure should be treated with medicines if weight loss and exercise do not work.  If you are 30-90 years old, ask your health care provider if you should take aspirin to prevent strokes.  Diabetes screening is done by taking a blood sample to check your blood glucose level after you have not eaten for a certain period of time (fasting). If you are not overweight and you do not have risk factors for diabetes, you should be screened once every 3 years starting at age 69. If you are overweight or obese and you are 61-31 years of age, you should be screened for diabetes every year as part of your cardiovascular risk assessment.  Breast cancer screening is essential preventive care for women. You should practice "breast self-awareness." This means understanding the normal appearance and feel of your breasts and may include breast self-examination. Any changes detected, no matter how small, should be reported to a health care provider. Women in their 72s and 30s should have a clinical breast exam (CBE) by a health care provider as part of a regular health exam every 1 to 3 years. After age 71, women should have a CBE every year. Starting at age 19, women should consider having a mammogram (breast X-ray test) every year. Women who have a family history of breast cancer should talk to their health care provider about genetic screening. Women at a high risk of breast cancer should talk to their health care providers about having an MRI and a mammogram every  year.  Breast cancer gene (BRCA)-related cancer risk assessment is recommended for women who have family members with BRCA-related cancers. BRCA-related cancers include breast, ovarian, tubal, and peritoneal cancers. Having family members with these cancers may be associated with an increased risk for harmful changes (mutations) in the breast cancer genes BRCA1 and BRCA2. Results of the assessment will determine the need for genetic counseling and BRCA1 and BRCA2 testing.  Your health care provider may recommend that you be screened regularly for cancer of the pelvic organs (ovaries, uterus, and vagina). This screening involves a pelvic examination, including checking for microscopic changes to the surface of your cervix (Pap test). You may be encouraged to have this screening done every 3 years, beginning at age 62.  For women ages 39-65, health care providers may recommend pelvic exams and Pap testing every 3 years, or they may recommend the Pap and pelvic exam, combined with testing for human papilloma virus (HPV), every 5 years. Some types of HPV increase your risk  of cervical cancer. Testing for HPV may also be done on women of any age with unclear Pap test results.  Other health care providers may not recommend any screening for nonpregnant women who are considered low risk for pelvic cancer and who do not have symptoms. Ask your health care provider if a screening pelvic exam is right for you.  If you have had past treatment for cervical cancer or a condition that could lead to cancer, you need Pap tests and screening for cancer for at least 20 years after your treatment. If Pap tests have been discontinued, your risk factors (such as having a new sexual partner) need to be reassessed to determine if screening should resume. Some women have medical problems that increase the chance of getting cervical cancer. In these cases, your health care provider may recommend more frequent screening and Pap  tests.  Colorectal cancer can be detected and often prevented. Most routine colorectal cancer screening begins at the age of 5 years and continues through age 54 years. However, your health care provider may recommend screening at an earlier age if you have risk factors for colon cancer. On a yearly basis, your health care provider may provide home test kits to check for hidden blood in the stool. Use of a small camera at the end of a tube, to directly examine the colon (sigmoidoscopy or colonoscopy), can detect the earliest forms of colorectal cancer. Talk to your health care provider about this at age 84, when routine screening begins. Direct exam of the colon should be repeated every 5-10 years through age 70 years, unless early forms of precancerous polyps or small growths are found.  People who are at an increased risk for hepatitis B should be screened for this virus. You are considered at high risk for hepatitis B if:  You were born in a country where hepatitis B occurs often. Talk with your health care provider about which countries are considered high risk.  Your parents were born in a high-risk country and you have not received a shot to protect against hepatitis B (hepatitis B vaccine).  You have HIV or AIDS.  You use needles to inject street drugs.  You live with, or have sex with, someone who has hepatitis B.  You get hemodialysis treatment.  You take certain medicines for conditions like cancer, organ transplantation, and autoimmune conditions.  Hepatitis C blood testing is recommended for all people born from [birth year] through 1965 and any individual with known risks for hepatitis C.  Practice safe sex. Use condoms and avoid high-risk sexual practices to reduce the spread of sexually transmitted infections (STIs). STIs include gonorrhea, chlamydia, syphilis, trichomonas, herpes, HPV, and human immunodeficiency virus (HIV). Herpes, HIV, and HPV are viral illnesses that have no cure.  They can result in disability, cancer, and death.  You should be screened for sexually transmitted illnesses (STIs) including gonorrhea and chlamydia if:  You are sexually active and are younger than 24 years.  You are older than 24 years and your health care provider tells you that you are at risk for this type of infection.  Your sexual activity has changed since you were last screened and you are at an increased risk for chlamydia or gonorrhea. Ask your health care provider if you are at risk.  If you are at risk of being infected with HIV, it is recommended that you take a prescription medicine daily to prevent HIV infection. This is called preexposure prophylaxis (PrEP). You are  considered at risk if:  You are sexually active and do not regularly use condoms or know the HIV status of your partner(s).  You take drugs by injection.  You are sexually active with a partner who has HIV.  Talk with your health care provider about whether you are at high risk of being infected with HIV. If you choose to begin PrEP, you should first be tested for HIV. You should then be tested every 3 months for as long as you are taking PrEP.  Osteoporosis is a disease in which the bones lose minerals and strength with aging. This can result in serious bone fractures or breaks. The risk of osteoporosis can be identified using a bone density scan. Women ages 20 years and over and women at risk for fractures or osteoporosis should discuss screening with their health care providers. Ask your health care provider whether you should take a calcium supplement or vitamin D to reduce the rate of osteoporosis.  Menopause can be associated with physical symptoms and risks. Hormone replacement therapy is available to decrease symptoms and risks. You should talk to your health care provider about whether hormone replacement therapy is right for you.  Use sunscreen. Apply sunscreen liberally and repeatedly throughout the  day. You should seek shade when your shadow is shorter than you. Protect yourself by wearing long sleeves, pants, a wide-brimmed hat, and sunglasses year round, whenever you are outdoors.  Once a month, do a whole body skin exam, using a mirror to look at the skin on your back. Tell your health care provider of new moles, moles that have irregular borders, moles that are larger than a pencil eraser, or moles that have changed in shape or color.  Stay current with required vaccines (immunizations).  Influenza vaccine. All adults should be immunized every year.  Tetanus, diphtheria, and acellular pertussis (Td, Tdap) vaccine. Pregnant women should receive 1 dose of Tdap vaccine during each pregnancy. The dose should be obtained regardless of the length of time since the last dose. Immunization is preferred during the 27th-36th week of gestation. An adult who has not previously received Tdap or who does not know her vaccine status should receive 1 dose of Tdap. This initial dose should be followed by tetanus and diphtheria toxoids (Td) booster doses every 10 years. Adults with an unknown or incomplete history of completing a 3-dose immunization series with Td-containing vaccines should begin or complete a primary immunization series including a Tdap dose. Adults should receive a Td booster every 10 years.  Varicella vaccine. An adult without evidence of immunity to varicella should receive 2 doses or a second dose if she has previously received 1 dose. Pregnant females who do not have evidence of immunity should receive the first dose after pregnancy. This first dose should be obtained before leaving the health care facility. The second dose should be obtained 4-8 weeks after the first dose.  Human papillomavirus (HPV) vaccine. Females aged 13-26 years who have not received the vaccine previously should obtain the 3-dose series. The vaccine is not recommended for use in pregnant females. However, pregnancy  testing is not needed before receiving a dose. If a female is found to be pregnant after receiving a dose, no treatment is needed. In that case, the remaining doses should be delayed until after the pregnancy. Immunization is recommended for any person with an immunocompromised condition through the age of 65 years if she did not get any or all doses earlier. During the 3-dose  series, the second dose should be obtained 4-8 weeks after the first dose. The third dose should be obtained 24 weeks after the first dose and 16 weeks after the second dose.  Zoster vaccine. One dose is recommended for adults aged 78 years or older unless certain conditions are present.  Measles, mumps, and rubella (MMR) vaccine. Adults born before [birth year] generally are considered immune to measles and mumps. Adults born in [birth year] or later should have 1 or more doses of MMR vaccine unless there is a contraindication to the vaccine or there is laboratory evidence of immunity to each of the three diseases. A routine second dose of MMR vaccine should be obtained at least 28 days after the first dose for students attending postsecondary schools, health care workers, or international travelers. People who received inactivated measles vaccine or an unknown type of measles vaccine during 1963-1967 should receive 2 doses of MMR vaccine. People who received inactivated mumps vaccine or an unknown type of mumps vaccine before 1979 and are at high risk for mumps infection should consider immunization with 2 doses of MMR vaccine. For females of childbearing age, rubella immunity should be determined. If there is no evidence of immunity, females who are not pregnant should be vaccinated. If there is no evidence of immunity, females who are pregnant should delay immunization until after pregnancy. Unvaccinated health care workers born before [birth year] who lack laboratory evidence of measles, mumps, or rubella immunity or laboratory confirmation of disease should  consider measles and mumps immunization with 2 doses of MMR vaccine or rubella immunization with 1 dose of MMR vaccine.  Pneumococcal 13-valent conjugate (PCV13) vaccine. When indicated, a person who is uncertain of his immunization history and has no record of immunization should receive the PCV13 vaccine. All adults 95 years of age and older should receive this vaccine. An adult aged 21 years or older who has certain medical conditions and has not been previously immunized should receive 1 dose of PCV13 vaccine. This PCV13 should be followed with a dose of pneumococcal polysaccharide (PPSV23) vaccine. Adults who are at high risk for pneumococcal disease should obtain the PPSV23 vaccine at least 8 weeks after the dose of PCV13 vaccine. Adults older than 67 years of age who have normal immune system function should obtain the PPSV23 vaccine dose at least 1 year after the dose of PCV13 vaccine.  Pneumococcal polysaccharide (PPSV23) vaccine. When PCV13 is also indicated, PCV13 should be obtained first. All adults aged 86 years and older should be immunized. An adult younger than age 38 years who has certain medical conditions should be immunized. Any person who resides in a nursing home or long-term care facility should be immunized. An adult smoker should be immunized. People with an immunocompromised condition and certain other conditions should receive both PCV13 and PPSV23 vaccines. People with human immunodeficiency virus (HIV) infection should be immunized as soon as possible after diagnosis. Immunization during chemotherapy or radiation therapy should be avoided. Routine use of PPSV23 vaccine is not recommended for American Indians, [place] Natives, or people younger than 65 years unless there are medical conditions that require PPSV23 vaccine. When indicated, people who have unknown immunization and have no record of immunization should receive PPSV23 vaccine. One-time revaccination 5 years after the first  dose of PPSV23 is recommended for people aged 19-64 years who have chronic kidney failure, nephrotic syndrome, asplenia, or immunocompromised conditions. People who received 1-2 doses of PPSV23 before age 62 years should receive another dose of PPSV23 vaccine  at age 77 years or later if at least 5 years have passed since the previous dose. Doses of PPSV23 are not needed for people immunized with PPSV23 at or after age 64 years.  Meningococcal vaccine. Adults with asplenia or persistent complement component deficiencies should receive 2 doses of quadrivalent meningococcal conjugate (MenACWY-D) vaccine. The doses should be obtained at least 2 months apart. Microbiologists working with certain meningococcal bacteria, [employer] recruits, people at risk during an outbreak, and people who travel to or live in countries with a high rate of meningitis should be immunized. A first-year college student up through age 33 years who is living in a residence hall should receive a dose if she did not receive a dose on or after her 16th birthday. Adults who have certain high-risk conditions should receive one or more doses of vaccine.  Hepatitis A vaccine. Adults who wish to be protected from this disease, have certain high-risk conditions, work with hepatitis A-infected animals, work in hepatitis A research labs, or travel to or work in countries with a high rate of hepatitis A should be immunized. Adults who were previously unvaccinated and who anticipate close contact with an international adoptee during the first 60 days after arrival in the [place] from a country with a high rate of hepatitis A should be immunized.  Hepatitis B vaccine. Adults who wish to be protected from this disease, have certain high-risk conditions, may be exposed to blood or other infectious body fluids, are household contacts or sex partners of hepatitis B positive people, are clients or workers in certain care facilities, or travel to or  work in countries with a high rate of hepatitis B should be immunized.  Haemophilus influenzae type b (Hib) vaccine. A previously unvaccinated person with asplenia or sickle cell disease or having a scheduled splenectomy should receive 1 dose of Hib vaccine. Regardless of previous immunization, a recipient of a hematopoietic stem cell transplant should receive a 3-dose series 6-12 months after her successful transplant. Hib vaccine is not recommended for adults with HIV infection. Preventive Services / Frequency Ages 38 to 52 years  Blood pressure check.** / Every 3-5 years.  Lipid and cholesterol check.** / Every 5 years beginning at age 45.  Clinical breast exam.** / Every 3 years for women in their 30s and 65s.  BRCA-related cancer risk assessment.** / For women who have family members with a BRCA-related cancer (breast, ovarian, tubal, or peritoneal cancers).  Pap test.** / Every 2 years from ages 7 through 48. Every 3 years starting at age 68 through age 20 or 43 with a history of 3 consecutive normal Pap tests.  HPV screening.** / Every 3 years from ages 71 through ages 68 to 42 with a history of 3 consecutive normal Pap tests.  Hepatitis C blood test.** / For any individual with known risks for hepatitis C.  Skin self-exam. / Monthly.  Influenza vaccine. / Every year.  Tetanus, diphtheria, and acellular pertussis (Tdap, Td) vaccine.** / Consult your health care provider. Pregnant women should receive 1 dose of Tdap vaccine during each pregnancy. 1 dose of Td every 10 years.  Varicella vaccine.** / Consult your health care provider. Pregnant females who do not have evidence of immunity should receive the first dose after pregnancy.  HPV vaccine. / 3 doses over 6 months, if 32 and younger. The vaccine is not recommended for use in pregnant females. However, pregnancy testing is not needed before receiving a dose.  Measles, mumps, rubella (MMR)  vaccine.** / You need at least 1 dose  of MMR if you were born in 1957 or later. You may also need a 2nd dose. For females of childbearing age, rubella immunity should be determined. If there is no evidence of immunity, females who are not pregnant should be vaccinated. If there is no evidence of immunity, females who are pregnant should delay immunization until after pregnancy.  Pneumococcal 13-valent conjugate (PCV13) vaccine.** / Consult your health care provider.  Pneumococcal polysaccharide (PPSV23) vaccine.** / 1 to 2 doses if you smoke cigarettes or if you have certain conditions.  Meningococcal vaccine.** / 1 dose if you are age 6 to 12 years and a [employer] researcher living in a residence hall, or have one of several medical conditions, you need to get vaccinated against meningococcal disease. You may also need additional booster doses.  Hepatitis A vaccine.** / Consult your health care provider.  Hepatitis B vaccine.** / Consult your health care provider.  Haemophilus influenzae type b (Hib) vaccine.** / Consult your health care provider. Ages 60 to 90 years  Blood pressure check.** / Every year.  Lipid and cholesterol check.** / Every 5 years beginning at age 43 years.  Lung cancer screening. / Every year if you are aged 54-80 years and have a 30-pack-year history of smoking and currently smoke or have quit within the past 15 years. Yearly screening is stopped once you have quit smoking for at least 15 years or develop a health problem that would prevent you from having lung cancer treatment.  Clinical breast exam.** / Every year after age 30 years.  BRCA-related cancer risk assessment.** / For women who have family members with a BRCA-related cancer (breast, ovarian, tubal, or peritoneal cancers).  Mammogram.** / Every year beginning at age 31 years and continuing for as long as you are in good health. Consult with your health care provider.  Pap test.** / Every 3 years starting at age 5 years through age  81 or 80 years with a history of 3 consecutive normal Pap tests.  HPV screening.** / Every 3 years from ages 41 years through ages 8 to 18 years with a history of 3 consecutive normal Pap tests.  Fecal occult blood test (FOBT) of stool. / Every year beginning at age 26 years and continuing until age 59 years. You may not need to do this test if you get a colonoscopy every 10 years.  Flexible sigmoidoscopy or colonoscopy.** / Every 5 years for a flexible sigmoidoscopy or every 10 years for a colonoscopy beginning at age 73 years and continuing until age 70 years.  Hepatitis C blood test.** / For all people born from [birth year] through 1967 and any individual with known risks for hepatitis C.  Skin self-exam. / Monthly.  Influenza vaccine. / Every year.  Tetanus, diphtheria, and acellular pertussis (Tdap/Td) vaccine.** / Consult your health care provider. Pregnant women should receive 1 dose of Tdap vaccine during each pregnancy. 1 dose of Td every 10 years.  Varicella vaccine.** / Consult your health care provider. Pregnant females who do not have evidence of immunity should receive the first dose after pregnancy.  Zoster vaccine.** / 1 dose for adults aged 80 years or older.  Measles, mumps, rubella (MMR) vaccine.** / You need at least 1 dose of MMR if you were born in 1957 or later. You may also need a second dose. For females of childbearing age, rubella immunity should be determined. If there is no evidence of immunity, females  who are not pregnant should be vaccinated. If there is no evidence of immunity, females who are pregnant should delay immunization until after pregnancy.  Pneumococcal 13-valent conjugate (PCV13) vaccine.** / Consult your health care provider.  Pneumococcal polysaccharide (PPSV23) vaccine.** / 1 to 2 doses if you smoke cigarettes or if you have certain conditions.  Meningococcal vaccine.** / Consult your health care provider.  Hepatitis A vaccine.** / Consult your  health care provider.  Hepatitis B vaccine.** / Consult your health care provider.  Haemophilus influenzae type b (Hib) vaccine.** / Consult your health care provider. Ages 30 years and over  Blood pressure check.** / Every year.  Lipid and cholesterol check.** / Every 5 years beginning at age 57 years.  Lung cancer screening. / Every year if you are aged 62-80 years and have a 30-pack-year history of smoking and currently smoke or have quit within the past 15 years. Yearly screening is stopped once you have quit smoking for at least 15 years or develop a health problem that would prevent you from having lung cancer treatment.  Clinical breast exam.** / Every year after age 20 years.  BRCA-related cancer risk assessment.** / For women who have family members with a BRCA-related cancer (breast, ovarian, tubal, or peritoneal cancers).  Mammogram.** / Every year beginning at age 65 years and continuing for as long as you are in good health. Consult with your health care provider.  Pap test.** / Every 3 years starting at age 23 years through age 1 or 98 years with 3 consecutive normal Pap tests. Testing can be stopped between 65 and 70 years with 3 consecutive normal Pap tests and no abnormal Pap or HPV tests in the past 10 years.  HPV screening.** / Every 3 years from ages 74 years through ages 68 or 11 years with a history of 3 consecutive normal Pap tests. Testing can be stopped between 65 and 70 years with 3 consecutive normal Pap tests and no abnormal Pap or HPV tests in the past 10 years.  Fecal occult blood test (FOBT) of stool. / Every year beginning at age 3 years and continuing until age 43 years. You may not need to do this test if you get a colonoscopy every 10 years.  Flexible sigmoidoscopy or colonoscopy.** / Every 5 years for a flexible sigmoidoscopy or every 10 years for a colonoscopy beginning at age 50 years and continuing until age 93 years.  Hepatitis C blood test.** /  For all people born from [birth year] through 1967 and any individual with known risks for hepatitis C.  Osteoporosis screening.** / A one-time screening for women ages 60 years and over and women at risk for fractures or osteoporosis.  Skin self-exam. / Monthly.  Influenza vaccine. / Every year.  Tetanus, diphtheria, and acellular pertussis (Tdap/Td) vaccine.** / 1 dose of Td every 10 years.  Varicella vaccine.** / Consult your health care provider.  Zoster vaccine.** / 1 dose for adults aged 68 years or older.  Pneumococcal 13-valent conjugate (PCV13) vaccine.** / Consult your health care provider.  Pneumococcal polysaccharide (PPSV23) vaccine.** / 1 dose for all adults aged 52 years and older.  Meningococcal vaccine.** / Consult your health care provider.  Hepatitis A vaccine.** / Consult your health care provider.  Hepatitis B vaccine.** / Consult your health care provider.  Haemophilus influenzae type b (Hib) vaccine.** / Consult your health care provider. ** Family history and personal history of risk and conditions may change your health care provider's recommendations.  This information is not intended to replace advice given to you by your health care provider. Make sure you discuss any questions you have with your health care provider.   Document Released: 01/03/2002 Document Revised: 11/28/2014 Document Reviewed: 04/04/2011 Elsevier Interactive Patient Education [employer].

## 2016-01-11 NOTE — Progress Notes (Signed)
Subjective:   Robin Obrien is a 65 y.o. female who presents for Medicare Annual (Subsequent) preventive examination.  Review of Systems:   Review of Systems  Constitutional: Negative for activity change, appetite change and fatigue.  HENT: Negative for hearing loss, congestion, tinnitus and ear discharge.   Eyes: Negative for visual disturbance (see optho q1y -- vision corrected to 20/20 with glasses).  Respiratory: Negative for cough, chest tightness and shortness of breath.   Cardiovascular: Negative for chest pain, palpitations and leg swelling.  Gastrointestinal: Negative for abdominal pain, diarrhea, constipation and abdominal distention.  Genitourinary: Negative for urgency, frequency, decreased urine volume and difficulty urinating.  Musculoskeletal: Negative for back pain, arthralgias and gait problem.  Skin: Negative for color change, pallor and rash.  Neurological: Negative for dizziness, light-headedness, numbness and headaches.  Hematological: Negative for adenopathy. Does not bruise/bleed easily.  Psychiatric/Behavioral: Negative for suicidal ideas, confusion, sleep disturbance, self-injury, dysphoric mood, decreased concentration and agitation.  Pt is able to read and write and can do all ADLs No risk for falling No abuse/ violence in home          Objective:     Vitals: BP 130/74 mmHg  Pulse 57  Temp(Src) 98.1 F (36.7 C) (Oral)  Ht '5\' 4"'$  (1.626 m)  Wt 147 lb (66.679 kg)  BMI 25.22 kg/m2  SpO2 98% BP 130/74 mmHg  Pulse 57  Temp(Src) 98.1 F (36.7 C) (Oral)  Ht '5\' 4"'$  (1.626 m)  Wt 147 lb (66.679 kg)  BMI 25.22 kg/m2  SpO2 98% General appearance: alert, cooperative, appears stated age and no distress Head: Normocephalic, without obvious abnormality, atraumatic Eyes: conjunctivae/corneas clear. PERRL, EOM's intact. Fundi benign. Ears: normal TM's and external ear canals both ears Nose: Nares normal. Septum midline. Mucosa normal. No drainage or sinus  tenderness. Throat: lips, mucosa, and tongue normal; teeth and gums normal Neck: no adenopathy, no carotid bruit, no JVD, supple, symmetrical, trachea midline and thyroid not enlarged, symmetric, no tenderness/mass/nodules Back: symmetric, no curvature. ROM normal. No CVA tenderness. Lungs: clear to auscultation bilaterally Breasts: normal appearance, no masses or tenderness Heart: regular rate and rhythm, S1, S2 normal, no murmur, click, rub or gallop Abdomen: soft, non-tender; bowel sounds normal; no masses,  no organomegaly Pelvic: not indicated; post-menopausal, no abnormal Pap smears in past Extremities: extremities normal, atraumatic, no cyanosis or edema Pulses: 2+ and symmetric Skin: Skin color, texture, turgor normal. No rashes or lesions Lymph nodes: Cervical, supraclavicular, and axillary nodes normal. Neurologic: Alert and oriented X 3, normal strength and tone. Normal symmetric reflexes. Normal coordination and gait Psych- no depression, no anxiety Tobacco History  Smoking status  . Former Smoker  Smokeless tobacco  . Never Used     Counseling given: Not Answered   Past Medical History  Diagnosis Date  . HYPOKALEMIA 03/18/2008  . ANXIETY 03/18/2008  . HYPERTENSION 03/18/2008  . ALLERGIC RHINITIS 03/18/2008  . GERD 03/18/2008  . Irritable bowel syndrome 01/01/2009  . CHOLELITHIASIS 03/18/2008  . RENAL CALCULUS, RIGHT 03/02/2010  . HEMATURIA UNSPECIFIED 03/01/2010  . 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 BACK PAIN 01/01/2009  . LEG CRAMPS 03/01/2010  . FLANK PAIN, RIGHT 03/01/2010  . LIVER FUNCTION TESTS, ABNORMAL 03/18/2008  . COLONIC POLYPS, HX OF 03/18/2008  . NEPHROLITHIASIS, HX OF 03/01/2010  . Preventative health care 04/03/2011  . Rectal fissure   . Tubular adenoma of colon    Past Surgical History  Procedure Laterality Date  . Cholecystectomy  90's  . Cyst  removed  1987    removed from left breast  . Fibroid tumor  1985   Family History  Problem Relation  Age of Onset  . Diabetes Brother   . Arthritis Other   . Cancer Other     breast and lung cancer  . Heart disease Other   . Hypertension Other   . Breast cancer Mother   . Cancer Mother 27    lung, breast  . Cancer Father   . Colon cancer Neg Hx   . Colon polyps Neg Hx    History  Sexual Activity  . Sexual Activity:  . Partners: Male    Outpatient Encounter Prescriptions as of 01/11/2016  Medication Sig  . aspirin 81 MG EC tablet Take 81 mg by mouth daily.    . benazepril (LOTENSIN) 20 MG tablet Take 1 tablet (20 mg total) by mouth daily.  . Cholecalciferol (VITAMIN D3) 1000 UNITS CAPS Take 2,000 Units by mouth daily.  . [DISCONTINUED] benazepril (LOTENSIN) 20 MG tablet Take 1 tablet (20 mg total) by mouth daily.  Marland Kitchen HYDROcodone-acetaminophen (NORCO/VICODIN) 5-325 MG tablet Take 1 tablet by mouth every 6 (six) hours as needed for moderate pain.  . [DISCONTINUED] HYDROcodone-acetaminophen (NORCO/VICODIN) 5-325 MG tablet Take 1 tablet by mouth every 6 (six) hours as needed for moderate pain.  . [DISCONTINUED] HYDROcodone-acetaminophen (NORCO/VICODIN) 5-325 MG tablet Take 1 tablet by mouth every 6 (six) hours as needed for moderate pain.  . [DISCONTINUED] hyoscyamine (LEVSIN SL) 0.125 MG SL tablet Take 1-2 tablets by mouth every 6 hours as needed for IBS   No facility-administered encounter medications on file as of 01/11/2016.    Activities of Daily Living In your present state of health, do you have any difficulty performing the following activities: 01/11/2016 04/17/2015  Hearing? N N  Vision? N Y  Difficulty concentrating or making decisions? N N  Walking or climbing stairs? Y Y  Dressing or bathing? N N  Doing errands, shopping? N -    Patient Care Team: Rosalita Chessman, DO as PCP - General (Family Medicine) Linward Natal, MD as Referring Physician (Ophthalmology) Jerene Bears, MD as Consulting Physician (Gastroenterology)    Assessment:    CPE Exercise Activities  and Dietary recommendations-- stretches, leisurely walking     Goals    None     Fall Risk Fall Risk  01/11/2016 01/08/2015 08/08/2013  Falls in the past year? No No No   Depression Screen PHQ 2/9 Scores 01/11/2016 01/08/2015 08/08/2013  PHQ - 2 Score 0 0 0     Cognitive Testing mmse 30/30   There is no immunization history on file for this patient. Screening Tests Health Maintenance  Topic Date Due  . Hepatitis C Screening  09-19-1951  . TETANUS/TDAP  04/16/2016 (Originally 11/24/1967)  . PNA vac Low Risk Adult (1 of 2 - PCV13) 04/16/2016 (Originally 11/23/2013)  . DEXA SCAN  06/09/2016 (Originally 11/23/2013)  . INFLUENZA VACCINE  01/10/2017 (Originally 06/22/2015)  . MAMMOGRAM  04/16/2017  . COLONOSCOPY  08/05/2018  . ZOSTAVAX  Addressed      Plan:    see avs During the course of the visit the patient was educated and counseled about the following appropriate screening and preventive services:   Vaccines to include Pneumoccal, Influenza, Hepatitis B, Td, Zostavax, HCV  Electrocardiogram  Cardiovascular Disease  Colorectal cancer screening  Bone density screening  Diabetes screening  Glaucoma screening  Mammography/PAP  Nutrition counseling   Patient Instructions (the written plan) was  given to the patient.  1. Estrogen deficiency   - DG Bone Density; Future  2. Visit for screening mammogram   - MM DIGITAL SCREENING BILATERAL; Future  3. Renal colic - HYDROcodone-acetaminophen (NORCO/VICODIN) 5-325 MG tablet; Take 1 tablet by mouth every 6 (six) hours as needed for moderate pain.  Dispense: 30 tablet; Refill: 0  4. Need for hepatitis C screening test   - Hepatitis C antibody; Future  5. Essential hypertension stable - Comp Met (CMET);  - CBC with Differential/Platelet; Future - Lipid panel; Future - POCT urinalysis dipstick; Future - benazepril (LOTENSIN) 20 MG tablet; Take 1 tablet (20 mg total) by mouth daily.  Dispense: 90 tablet; Refill:  1  6. Medicare annual wellness visit, subsequent See AVS ghm utd Check labs  Garnet Koyanagi, DO  01/12/2016

## 2016-01-11 NOTE — Progress Notes (Signed)
Pre visit review using our clinic review tool, if applicable. No additional management support is needed unless otherwise documented below in the visit note. 

## 2016-01-12 ENCOUNTER — Encounter: Payer: Self-pay | Admitting: Family Medicine

## 2016-01-12 MED ORDER — HYDROCODONE-ACETAMINOPHEN 5-325 MG PO TABS: 1.0000 | ORAL_TABLET | Freq: Four times a day (QID) | ORAL | Status: DC | PRN

## 2016-01-13 ENCOUNTER — Other Ambulatory Visit: Payer: Medicare Other

## 2016-01-15 ENCOUNTER — Other Ambulatory Visit (INDEPENDENT_AMBULATORY_CARE_PROVIDER_SITE_OTHER): Payer: Medicare Other

## 2016-01-15 DIAGNOSIS — I10 Essential (primary) hypertension: Secondary | ICD-10-CM | POA: Diagnosis not present

## 2016-01-15 DIAGNOSIS — R8299 Other abnormal findings in urine: Secondary | ICD-10-CM | POA: Diagnosis not present

## 2016-01-15 DIAGNOSIS — Z1159 Encounter for screening for other viral diseases: Secondary | ICD-10-CM | POA: Diagnosis not present

## 2016-01-15 DIAGNOSIS — R82998 Other abnormal findings in urine: Secondary | ICD-10-CM

## 2016-01-15 LAB — COMPREHENSIVE METABOLIC PANEL
ALBUMIN: 4.4 g/dL (ref 3.5–5.2)
ALK PHOS: 75 U/L (ref 39–117)
ALT: 18 U/L (ref 0–35)
AST: 21 U/L (ref 0–37)
BILIRUBIN TOTAL: 0.5 mg/dL (ref 0.2–1.2)
BUN: 14 mg/dL (ref 6–23)
CO2: 29 mEq/L (ref 19–32)
Calcium: 9.4 mg/dL (ref 8.4–10.5)
Chloride: 104 mEq/L (ref 96–112)
Creatinine, Ser: 0.57 mg/dL (ref 0.40–1.20)
GFR: 136 mL/min (ref >60.00)
Glucose, Bld: 96 mg/dL (ref 70–99)
POTASSIUM: 3.8 meq/L (ref 3.5–5.1)
SODIUM: 139 meq/L (ref 135–145)
TOTAL PROTEIN: 7.6 g/dL (ref 6.0–8.3)

## 2016-01-15 LAB — CBC WITH DIFFERENTIAL/PLATELET
BASOS ABS: 0.1 10*3/uL (ref 0.0–0.1)
Basophils Relative: 1.2 % (ref 0.0–3.0)
EOS ABS: 0.2 10*3/uL (ref 0.0–0.7)
EOS PCT: 5 % (ref 0.0–5.0)
HCT: 36.8 % (ref 36.0–46.0)
HEMOGLOBIN: 12.1 g/dL (ref 12.0–15.0)
LYMPHS ABS: 2.2 10*3/uL (ref 0.7–4.0)
Lymphocytes Relative: 48.9 % — ABNORMAL HIGH (ref 12.0–46.0)
MCHC: 32.9 g/dL (ref 30.0–36.0)
MCV: 84.5 fl (ref 78.0–100.0)
MONO ABS: 0.4 10*3/uL (ref 0.1–1.0)
Monocytes Relative: 7.6 % (ref 3.0–12.0)
NEUTROS PCT: 37.3 % — AB (ref 43.0–77.0)
Neutro Abs: 1.7 10*3/uL (ref 1.4–7.7)
Platelets: 228 10*3/uL (ref 150.0–400.0)
RBC: 4.35 Mil/uL (ref 3.87–5.11)
RDW: 13.7 % (ref 11.5–15.5)
WBC: 4.6 10*3/uL (ref 4.0–10.5)

## 2016-01-15 LAB — LIPID PANEL
CHOLESTEROL: 197 mg/dL (ref 0–200)
HDL: 69.1 mg/dL (ref >39.00)
LDL CALC: 111 mg/dL — AB (ref 0–99)
NonHDL: 127.54
TRIGLYCERIDES: 85 mg/dL (ref 0.0–149.0)
Total CHOL/HDL Ratio: 3
VLDL: 17 mg/dL (ref 0.0–40.0)

## 2016-01-15 LAB — HEPATITIS C ANTIBODY: HCV Ab: NEGATIVE

## 2016-01-15 LAB — POCT URINALYSIS DIPSTICK
BILIRUBIN UA: NEGATIVE
Blood, UA: NEGATIVE
GLUCOSE UA: NEGATIVE
Ketones, UA: NEGATIVE
Nitrite, UA: NEGATIVE
PH UA: 6
Protein, UA: NEGATIVE
Spec Grav, UA: 1.01
Urobilinogen, UA: 0.2

## 2016-01-15 NOTE — Addendum Note (Signed)
Addended by: Caffie Pinto on: 01/15/2016 01:14 PM   Modules accepted: Orders

## 2016-01-18 LAB — URINE CULTURE

## 2016-02-05 ENCOUNTER — Ambulatory Visit
Admission: RE | Admit: 2016-02-05 | Discharge: 2016-02-05 | Disposition: A | Discharge status: Home / Self Care | Payer: Medicare Other | Source: Ambulatory Visit | Attending: Family Medicine | Admitting: Family Medicine

## 2016-02-05 DIAGNOSIS — Z780 Asymptomatic menopausal state: Secondary | ICD-10-CM | POA: Diagnosis not present

## 2016-02-05 DIAGNOSIS — Z1231 Encounter for screening mammogram for malignant neoplasm of breast: Secondary | ICD-10-CM | POA: Diagnosis not present

## 2016-02-05 DIAGNOSIS — M810 Age-related osteoporosis without current pathological fracture: Secondary | ICD-10-CM | POA: Diagnosis not present

## 2016-02-05 DIAGNOSIS — E2839 Other primary ovarian failure: Secondary | ICD-10-CM

## 2016-02-10 ENCOUNTER — Other Ambulatory Visit: Payer: Self-pay

## 2016-02-10 DIAGNOSIS — M810 Age-related osteoporosis without current pathological fracture: Secondary | ICD-10-CM | POA: Insufficient documentation

## 2016-02-10 MED ORDER — ALENDRONATE SODIUM 70 MG PO TABS: 70.0000 mg | ORAL_TABLET | ORAL | Status: DC

## 2016-02-25 DIAGNOSIS — M160 Bilateral primary osteoarthritis of hip: Secondary | ICD-10-CM | POA: Diagnosis not present

## 2016-03-16 DIAGNOSIS — H348122 Central retinal vein occlusion, left eye, stable: Secondary | ICD-10-CM | POA: Diagnosis not present

## 2016-03-16 DIAGNOSIS — H2513 Age-related nuclear cataract, bilateral: Secondary | ICD-10-CM | POA: Diagnosis not present

## 2016-03-16 DIAGNOSIS — H5213 Myopia, bilateral: Secondary | ICD-10-CM | POA: Diagnosis not present

## 2016-03-16 DIAGNOSIS — Z83511 Family history of glaucoma: Secondary | ICD-10-CM | POA: Diagnosis not present

## 2016-07-08 ENCOUNTER — Telehealth: Payer: Self-pay | Admitting: Family Medicine

## 2016-07-08 DIAGNOSIS — M160 Bilateral primary osteoarthritis of hip: Secondary | ICD-10-CM

## 2016-07-08 NOTE — Telephone Encounter (Signed)
Ok to refill-- need contract and uds

## 2016-07-08 NOTE — Telephone Encounter (Signed)
°  Relationship to patient: Self   Can be reached: [phone]    Reason for call: Request refill on HYDROcodone-acetaminophen (NORCO/VICODIN) 5-325 MG tablet [id number]

## 2016-07-08 NOTE — Telephone Encounter (Signed)
Last seen 01/11/16 and filled 01/12/16 #30 No contract and No UDS   Please advise    KP

## 2016-07-11 DIAGNOSIS — Z79891 Long term (current) use of opiate analgesic: Secondary | ICD-10-CM | POA: Diagnosis not present

## 2016-07-11 MED ORDER — HYDROCODONE-ACETAMINOPHEN 5-325 MG PO TABS: 1.0000 | ORAL_TABLET | Freq: Four times a day (QID) | ORAL | 0 refills | Status: DC | PRN

## 2016-07-11 NOTE — Telephone Encounter (Signed)
VM left making the patient aware the medication is ready for pick up.      KP

## 2016-07-13 ENCOUNTER — Encounter: Payer: Self-pay | Admitting: Family Medicine

## 2016-07-19 DIAGNOSIS — M160 Bilateral primary osteoarthritis of hip: Secondary | ICD-10-CM | POA: Diagnosis not present

## 2016-07-19 DIAGNOSIS — I10 Essential (primary) hypertension: Secondary | ICD-10-CM | POA: Diagnosis not present

## 2016-09-02 ENCOUNTER — Telehealth: Payer: Self-pay | Admitting: Family Medicine

## 2016-09-02 ENCOUNTER — Other Ambulatory Visit: Payer: Self-pay | Admitting: Family Medicine

## 2016-09-02 DIAGNOSIS — I10 Essential (primary) hypertension: Secondary | ICD-10-CM

## 2016-09-02 MED ORDER — BENAZEPRIL HCL 20 MG PO TABS: 20.0000 mg | ORAL_TABLET | Freq: Every day | ORAL | 0 refills | Status: DC

## 2016-09-02 NOTE — Telephone Encounter (Signed)
Patient is requesting a refill of this medication. benazepril (LOTENSIN) 20 MG tablet Please advise Patient only has one pill left.    Patient phone: [phone] Pharmacy: [facility], [place]

## 2016-09-06 DIAGNOSIS — K589 Irritable bowel syndrome without diarrhea: Secondary | ICD-10-CM | POA: Diagnosis not present

## 2016-09-06 DIAGNOSIS — M25559 Pain in unspecified hip: Secondary | ICD-10-CM | POA: Diagnosis not present

## 2016-09-06 DIAGNOSIS — I10 Essential (primary) hypertension: Secondary | ICD-10-CM | POA: Diagnosis not present

## 2016-12-13 DIAGNOSIS — H348122 Central retinal vein occlusion, left eye, stable: Secondary | ICD-10-CM | POA: Diagnosis not present

## 2016-12-13 DIAGNOSIS — H5203 Hypermetropia, bilateral: Secondary | ICD-10-CM | POA: Diagnosis not present

## 2016-12-13 DIAGNOSIS — H2513 Age-related nuclear cataract, bilateral: Secondary | ICD-10-CM | POA: Diagnosis not present

## 2016-12-13 DIAGNOSIS — H524 Presbyopia: Secondary | ICD-10-CM | POA: Diagnosis not present

## 2017-02-01 ENCOUNTER — Telehealth: Payer: Self-pay | Admitting: Family Medicine

## 2017-02-01 NOTE — Telephone Encounter (Signed)
Called patient to schedule awv. Patient stated that she would like to give office a call back to schedule appt.

## 2017-03-28 NOTE — Telephone Encounter (Signed)
Called patient and left message to return call to schedule AWV w/ Health Coach and follow-up visit with PCP.

## 2017-09-11 ENCOUNTER — Ambulatory Visit (INDEPENDENT_AMBULATORY_CARE_PROVIDER_SITE_OTHER): Payer: Self-pay | Admitting: Orthopaedic Surgery

## 2017-09-12 ENCOUNTER — Ambulatory Visit (INDEPENDENT_AMBULATORY_CARE_PROVIDER_SITE_OTHER): Payer: Medicare Other

## 2017-09-12 ENCOUNTER — Encounter (INDEPENDENT_AMBULATORY_CARE_PROVIDER_SITE_OTHER): Payer: Self-pay | Admitting: Orthopaedic Surgery

## 2017-09-12 ENCOUNTER — Ambulatory Visit (INDEPENDENT_AMBULATORY_CARE_PROVIDER_SITE_OTHER): Payer: Medicare Other | Admitting: Orthopaedic Surgery

## 2017-09-12 VITALS — Resp 14 | Ht 64.0 in | Wt 147.0 lb

## 2017-09-12 DIAGNOSIS — M25551 Pain in right hip: Secondary | ICD-10-CM | POA: Diagnosis not present

## 2017-09-12 DIAGNOSIS — M25552 Pain in left hip: Secondary | ICD-10-CM

## 2017-09-12 MED ORDER — TRAMADOL HCL 50 MG PO TABS: 50.0000 mg | ORAL_TABLET | Freq: Every day | ORAL | 0 refills | Status: DC

## 2017-09-12 NOTE — Progress Notes (Signed)
Office Visit Note   Patient: Robin Obrien           Date of Birth: 07/30/1951           MRN: [id number] Visit Date: 09/12/2017              Requested by: [address], [place], [place] [address] [place], [place] [address] PCP: Carollee Robin Obrien, Robin Apa, DO   Assessment & Plan: Visit Diagnoses:  1. Pain in right hip   2. Pain in left hip     Plan: Long discussion over 45 minutes with Mrs. Robin Obrien and her daughter. I've discussed the endstage arthritis and different treatment options in regards to her hips. I think there is some element of pain from her lumbar spine but I think the overwhelming majority originates from her hips. She is severely limited ability to ambulate. Is a little if any movement in her hips from a neutral position. I've discussed the surgery the hospitalization and the the rehabilitation son the potential complications. We talked about clearance forms hospitalization possible rehabilitation. The daughter is cable of taking care of her mother at home that. After much discussion and they like to consider something around the first of the year. I discussed the clearance form and the need to pick that up sometime in late November should they decide they want to proceed with surgery. Also try tramadol for pain on a limited basis. She likes to drink wine at night and therefore will take it only in the morning  Follow-Up Instructions: No Follow-up on file.   Orders:  Orders Placed This Encounter  Procedures  . XR HIP UNILAT W OR W/O PELVIS 2-3 VIEWS LEFT  . XR Lumbar Spine 2-3 Views   No orders of the defined types were placed in this encounter.     Procedures: No procedures performed   Clinical Data: No additional findings.   Subjective: Chief Complaint  Patient presents with  . Left Leg - Pain    Ms. Robin Obrien is a 66  y o that presents with chronic bilateral Leg , thigh, hip, LB pain.   . Right Leg - Pain    She relates she now has to use a  wheelchair sometimes and also ambulates with a cane.  Mrs. Robin Obrien is accompanied by her daughter and here for evaluation of chronic pain she is having in her back, both hips, thighs and knees. She notes having some pain in her thighs ever since she was a "child". This oftentimes will interfere with her ability to participate in athletics. Approximately 5-6 years ago she was told by another orthopedist and she bilateral hip replacements. She didn't think that's what she wanted to pursue and so she has been taking over-the-counter medicines. Only in the last month or so she had increasing pain to the point proximal to the office visit. She's had a little bit of back pain but predominantly thigh and groin pain. Both eyes feel heavy and tired. It's a real problem when she stands or walks any distance. She has some soreness in her hips and knees even without weightbearing. She's not had any numbness or tingling or swelling. She does use a cane. She has not had any injury or trauma. She is at the point now where the pain is interfering with her activities of daily living.  HPI  Review of Systems  Constitutional: Negative for chills, fatigue and fever.  Eyes: Negative for itching.  Respiratory: Negative  for chest tightness and shortness of breath.   Cardiovascular: Negative for chest pain, palpitations and leg swelling.  Gastrointestinal: Negative for blood in stool, constipation and diarrhea.  Endocrine: Negative for polyuria.  Genitourinary: Negative for dysuria.  Musculoskeletal: Positive for back pain and joint swelling. Negative for neck pain and neck stiffness.  Allergic/Immunologic: Negative for immunocompromised state.  Neurological: Positive for weakness. Negative for dizziness and numbness.  Hematological: Does not bruise/bleed easily.  Psychiatric/Behavioral: The patient is not nervous/anxious.      Objective: Vital Signs: Resp 14   Ht 5\' 4"  (1.626 m)   Wt 147 lb (66.7 kg)   BMI 25.23  kg/m   Physical Exam  Ortho Exam Awake alert and oriented 3. Somewhat of a poor historian in terms of dates and the exact timeline of her pain. Also difficult to localize her discomfort. She does have considerable pain with any attempted internal/external rotation of either hip. Straight leg raise is negative. Pulses distally no swelling distally neurovascular exam intact. No percussible tenderness of lumbar spine. Knees were not hot red swollen or effused. Skin intact. Specialty Comments:  No specialty comments available.  Imaging: No results found.   PMFS History: Patient Active Problem List   Diagnosis Date Noted  . Osteoporosis 02/10/2016  . Paresthesia 08/08/2013  . Muscle cramps 08/08/2013  . Acute upper respiratory infections of unspecified site 04/12/2013  . Other malaise and fatigue 04/12/2013  . Bilateral hip pain 04/06/2011  . Preventative health care 04/03/2011  . RENAL CALCULUS, RIGHT 03/02/2010  . HEMATURIA UNSPECIFIED 03/01/2010  . LEG CRAMPS 03/01/2010  . FLANK PAIN, RIGHT 03/01/2010  . NEPHROLITHIASIS, HX OF 03/01/2010  . Irritable bowel syndrome 01/01/2009  . BACK PAIN 01/01/2009  . 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 HYPOKALEMIA 03/18/2008  . ANXIETY 03/18/2008  . Essential hypertension 03/18/2008  . ALLERGIC RHINITIS 03/18/2008  . GERD 03/18/2008  . CHOLELITHIASIS 03/18/2008  . LIVER FUNCTION TESTS, ABNORMAL 03/18/2008  . COLONIC POLYPS, HX OF 03/18/2008   Past Medical History:  Diagnosis Date  . ALLERGIC RHINITIS 03/18/2008  . ANXIETY 03/18/2008  . BACK PAIN 01/01/2009  . CHOLELITHIASIS 03/18/2008  . COLONIC POLYPS, HX OF 03/18/2008  . FLANK PAIN, RIGHT 03/01/2010  . GERD 03/18/2008  . HEMATURIA UNSPECIFIED 03/01/2010  . HYPERTENSION 03/18/2008  . HYPOKALEMIA 03/18/2008  . Irritable bowel syndrome 01/01/2009  . LEG CRAMPS 03/01/2010  . LIVER FUNCTION TESTS, ABNORMAL 03/18/2008  . 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 NEPHROLITHIASIS, HX OF  03/01/2010  . Preventative health care 04/03/2011  . Rectal fissure   . RENAL CALCULUS, RIGHT 03/02/2010  . Tubular adenoma of colon     Family History  Problem Relation Age of Onset  . Diabetes Brother   . Arthritis Other   . Cancer Other        breast and lung cancer  . Heart disease Other   . Hypertension Other   . Breast cancer Mother   . Cancer Mother 54       lung, breast  . Cancer Father   . Colon cancer Neg Hx   . Colon polyps Neg Hx     Past Surgical History:  Procedure Laterality Date  . CHOLECYSTECTOMY  90's  . cyst removed  1987   removed from left breast  . fibroid tumor  1985   Social History   Occupational History  . Retired    Social History Main Topics  . Smoking status: Former Research scientist (life sciences)  . Smokeless tobacco: Never Used  .  Alcohol use 1.8 oz/week    3 Glasses of wine per week     Comment: occ  . Drug use: No  . Sexual activity: Yes    Partners: Male

## 2017-10-16 ENCOUNTER — Telehealth (INDEPENDENT_AMBULATORY_CARE_PROVIDER_SITE_OTHER): Payer: Self-pay | Admitting: Orthopedic Surgery

## 2017-10-16 NOTE — Telephone Encounter (Signed)
Left message on Robin Obrien's voicemail to please return my call.  Need to discuss possible dates for surgery

## 2017-11-16 ENCOUNTER — Other Ambulatory Visit: Payer: Self-pay | Admitting: Emergency Medicine

## 2017-11-16 DIAGNOSIS — Z139 Encounter for screening, unspecified: Secondary | ICD-10-CM

## 2017-12-01 ENCOUNTER — Other Ambulatory Visit: Payer: Self-pay | Admitting: Family Medicine

## 2017-12-01 DIAGNOSIS — Z139 Encounter for screening, unspecified: Secondary | ICD-10-CM

## 2017-12-26 ENCOUNTER — Ambulatory Visit: Payer: Medicare Other

## 2018-01-08 ENCOUNTER — Telehealth (INDEPENDENT_AMBULATORY_CARE_PROVIDER_SITE_OTHER): Payer: Self-pay | Admitting: Orthopaedic Surgery

## 2018-01-08 NOTE — Telephone Encounter (Signed)
FYI: I called patient today regarding setting up her hip surgery with Dr. Durward Fortes.  She informed me that she is scheduled with Dr Wynelle Link for April 17th.

## 2018-01-09 NOTE — Telephone Encounter (Signed)
FYI

## 2018-01-09 NOTE — Telephone Encounter (Signed)
thanks

## 2018-01-14 ENCOUNTER — Ambulatory Visit: Payer: Self-pay | Admitting: Orthopedic Surgery

## 2018-01-29 ENCOUNTER — Encounter (HOSPITAL_COMMUNITY): Payer: Self-pay | Admitting: *Deleted

## 2018-03-07 ENCOUNTER — Inpatient Hospital Stay: Admit: 2018-03-07 | Payer: Medicare Other | Admitting: Orthopedic Surgery

## 2018-03-07 SURGERY — ARTHROPLASTY, HIP, TOTAL, ANTERIOR APPROACH
Anesthesia: Choice | Site: Hip | Laterality: Left

## 2018-06-28 ENCOUNTER — Encounter: Payer: Self-pay | Admitting: Internal Medicine

## 2021-05-10 ENCOUNTER — Ambulatory Visit: Payer: Medicare Other | Admitting: Gastroenterology

## 2021-05-14 DIAGNOSIS — D489 Neoplasm of uncertain behavior, unspecified: Secondary | ICD-10-CM | POA: Diagnosis not present

## 2021-05-14 DIAGNOSIS — L820 Inflamed seborrheic keratosis: Secondary | ICD-10-CM | POA: Diagnosis not present

## 2021-06-17 ENCOUNTER — Ambulatory Visit: Payer: Medicare Other | Admitting: Nurse Practitioner

## 2021-09-02 DIAGNOSIS — M160 Bilateral primary osteoarthritis of hip: Secondary | ICD-10-CM | POA: Diagnosis not present

## 2021-11-05 DIAGNOSIS — E538 Deficiency of other specified B group vitamins: Secondary | ICD-10-CM | POA: Diagnosis not present

## 2021-11-05 DIAGNOSIS — Z8249 Family history of ischemic heart disease and other diseases of the circulatory system: Secondary | ICD-10-CM | POA: Diagnosis not present

## 2021-11-05 DIAGNOSIS — R7303 Prediabetes: Secondary | ICD-10-CM | POA: Diagnosis not present

## 2021-11-05 DIAGNOSIS — Z0000 Encounter for general adult medical examination without abnormal findings: Secondary | ICD-10-CM | POA: Diagnosis not present

## 2021-11-05 DIAGNOSIS — I10 Essential (primary) hypertension: Secondary | ICD-10-CM | POA: Diagnosis not present

## 2021-11-09 NOTE — H&P (Signed)
TOTAL HIP ADMISSION H&P  Patient is admitted for left total hip arthroplasty.  Subjective:  Chief Complaint: Left hip pain  HPI: Robin Obrien, 70 y.o. female, has a history of pain and functional disability in the left hip due to arthritis and patient has failed non-surgical conservative treatments for greater than 12 weeks to include NSAID's and/or analgesics and activity modification. Onset of symptoms was gradual, starting  several  years ago with gradually worsening course since that time. The patient noted no past surgery on the left hip. Patient currently rates pain in the left hip at 7 out of 10 with activity. Patient has night pain, worsening of pain with activity and weight bearing, pain that interfers with activities of daily living, and pain with passive range of motion. Patient has evidence of  end-stage arthritis in both hips where it looks like they are both essentially fused at this time. She is bone on bone with massive osteophyte formation  by imaging studies. This condition presents safety issues increasing the risk of falls. There is no current active infection.  Patient Active Problem List   Diagnosis Date Noted   Osteoporosis 02/10/2016   Paresthesia 08/08/2013   Muscle cramps 08/08/2013   Acute upper respiratory infections of unspecified site 04/12/2013   Other malaise and fatigue 04/12/2013   Bilateral hip pain 04/06/2011   Preventative health care 04/03/2011   RENAL CALCULUS, RIGHT 03/02/2010   HEMATURIA UNSPECIFIED 03/01/2010   LEG CRAMPS 03/01/2010   FLANK PAIN, RIGHT 03/01/2010   NEPHROLITHIASIS, HX OF 03/01/2010   Irritable bowel syndrome 01/01/2009   BACK PAIN 01/01/2009   LUMBAR RADICULOPATHY, LEFT 09/09/2008   HYPOKALEMIA 03/18/2008   ANXIETY 03/18/2008   Essential hypertension 03/18/2008   ALLERGIC RHINITIS 03/18/2008   GERD 03/18/2008   CHOLELITHIASIS 03/18/2008   LIVER FUNCTION TESTS, ABNORMAL 03/18/2008   COLONIC POLYPS, HX OF 03/18/2008     Past Medical History:  Diagnosis Date   ALLERGIC RHINITIS 03/18/2008   ANXIETY 03/18/2008   BACK PAIN 01/01/2009   CHOLELITHIASIS 03/18/2008   COLONIC POLYPS, HX OF 03/18/2008   FLANK PAIN, RIGHT 03/01/2010   GERD 03/18/2008   HEMATURIA UNSPECIFIED 03/01/2010   HYPERTENSION 03/18/2008   HYPOKALEMIA 03/18/2008   Irritable bowel syndrome 01/01/2009   LEG CRAMPS 03/01/2010   LIVER FUNCTION TESTS, ABNORMAL 03/18/2008   LUMBAR RADICULOPATHY, LEFT 09/09/2008   NEPHROLITHIASIS, HX OF 03/01/2010   Preventative health care 04/03/2011   Rectal fissure    RENAL CALCULUS, RIGHT 03/02/2010   Tubular adenoma of colon     Past Surgical History:  Procedure Laterality Date   CHOLECYSTECTOMY  90's   cyst removed  1987   removed from left breast   fibroid tumor  1985    Prior to Admission medications   Medication Sig Start Date End Date Taking? Authorizing Provider  aspirin 81 MG EC tablet Take 81 mg by mouth daily.      [provider]  Cholecalciferol (VITAMIN D3) 1000 UNITS CAPS Take 2,000 Units by mouth daily.    [provider]  hydrochlorothiazide (MICROZIDE) 12.5 MG capsule Take 12.5 mg by mouth. 04/10/17   [provider]  traMADol (ULTRAM) 50 MG tablet Take 1-2 tablets (50-100 mg total) by mouth daily. 09/12/17   Cherylann Ratel, PA-C    Allergies  Allergen Reactions   Lactose Intolerance (Gi)    Mobic [Meloxicam] Other (See Comments)    Pt has had issues with bleeding and never wants to take it again  Social History   Socioeconomic History   Marital status: Single    Spouse name: Not on file   Number of children: 2   Years of education: Not on file   Highest education level: Not on file  Occupational History   Occupation: Retired  Tobacco Use   Smoking status: Former   Smokeless tobacco: Never  Substance and Sexual Activity   Alcohol use: Yes    Alcohol/week: 3.0 standard drinks    Types: 3 Glasses of wine per week    Comment: occ   Drug use:  No   Sexual activity: Yes    Partners: Male  Other Topics Concern   Not on file  Social History Narrative   Not on file   Social Determinants of Health   Financial Resource Strain: Not on file  Food Insecurity: Not on file  Transportation Needs: Not on file  Physical Activity: Not on file  Stress: Not on file  Social Connections: Not on file  Intimate Partner Violence: Not on file    Tobacco Use: Not on file   Social History   Substance and Sexual Activity  Alcohol Use Yes   Alcohol/week: 3.0 standard drinks   Types: 3 Glasses of wine per week   Comment: occ    Family History  Problem Relation Age of Onset   Breast cancer Mother    Cancer Mother 17       lung, breast   Cancer Father    Diabetes Brother    Arthritis Other    Cancer Other        breast and lung cancer   Heart disease Other    Hypertension Other    Colon cancer Neg Hx    Colon polyps Neg Hx     Review of Systems  Constitutional:  Negative for chills and fever.  HENT:  Negative for congestion, sore throat and tinnitus.   Eyes:  Negative for double vision, photophobia and pain.  Respiratory:  Negative for cough, shortness of breath and wheezing.   Cardiovascular:  Negative for chest pain, palpitations and orthopnea.  Gastrointestinal:  Negative for heartburn, nausea and vomiting.  Genitourinary:  Negative for dysuria, frequency and urgency.  Musculoskeletal:  Positive for joint pain.  Neurological:  Negative for dizziness, weakness and headaches.    Objective:  Physical Exam: Well nourished and well developed.  General: Alert and oriented x3, cooperative and pleasant, no acute distress.  Head: normocephalic, atraumatic, neck supple.  Eyes: EOMI.  Musculoskeletal:  Left Hip Exam:   The range of motion: Flexion to 70 degrees. I can not flex her past 70 degrees. Internal Rotation to 0 degrees, External Rotation to 0 degrees. Abduction to 0 degrees without discomfort.   There is no  tenderness over the greater trochanter bursa.   Calves soft and nontender. Motor function intact in LE. Strength 5/5 LE bilaterally. Neuro: Distal pulses 2+. Sensation to light touch intact in LE.   Imaging Review Plain radiographs demonstrate severe degenerative joint disease of the left hip. The bone quality appears to be adequate for age and reported activity level.  Assessment/Plan:  End stage arthritis, left hip  The patient history, physical examination, clinical judgement of the provider and imaging studies are consistent with end stage degenerative joint disease of the left hip and total hip arthroplasty is deemed medically necessary. The treatment options including medical management, injection therapy, arthroscopy and arthroplasty were discussed at length. The risks and benefits of total hip arthroplasty were presented  and reviewed. The risks due to aseptic loosening, infection, stiffness, dislocation/subluxation, thromboembolic complications and other imponderables were discussed. The patient acknowledged the explanation, agreed to proceed with the plan and consent was signed. Patient is being admitted for inpatient treatment for surgery, pain control, PT, OT, prophylactic antibiotics, VTE prophylaxis, progressive ambulation and ADLs and discharge planning.The patient is planning to be discharged  hoime .   Patient's anticipated LOS is less than 2 midnights, meeting these requirements: - Younger than 17 - Lives within 1 hour of care - Has a competent adult at home to recover with post-op recover - NO history of  - Chronic pain requiring opiods  - Diabetes  - Coronary Artery Disease  - Heart failure  - Heart attack  - Stroke  - DVT/VTE  - Cardiac arrhythmia  - Respiratory Failure/COPD  - Renal failure  - Anemia  - Advanced Liver disease  Therapy Plans: HEP Disposition: Home with daughter Planned DVT Prophylaxis: Aspirin 325 mg BID DME Needed: None PCP: Cathi Roan,  PA-C (clearance received) TXA: IV Allergies: Meloxicam (bleeding) Anesthesia Concerns: None BMI: 24.5 Last HgbA1c: Not diabetic.  Pharmacy: [facility])  Other: SDD  - Patient was instructed on what medications to stop prior to surgery. - Follow-up visit in 2 weeks with Dr. Wynelle Link - Begin physical therapy following surgery - Pre-operative lab work as pre-surgical testing - Prescriptions will be provided in hospital at time of discharge  Theresa Duty, PA-C Orthopedic Surgery [facility] [place]

## 2021-11-17 NOTE — Patient Instructions (Addendum)
DUE TO COVID-19 ONLY ONE VISITOR IS ALLOWED TO COME WITH YOU AND STAY IN THE WAITING ROOM ONLY DURING PRE OP AND PROCEDURE DAY OF SURGERY IF YOU ARE GOING HOME AFTER SURGERY. IF YOU ARE SPENDING THE NIGHT 2 PEOPLE MAY VISIT WITH YOU IN YOUR PRIVATE ROOM AFTER SURGERY UNTIL VISITING  HOURS ARE OVER AT 800 PM AND 1  VISITOR  MAY  SPEND THE NIGHT.                  Robin Obrien   Your procedure is scheduled on: 11/24/21   Report to [facility] Main  Entrance   Report to admitting at 7:50 AM     Call this number if you have problems the morning of surgery [phone]    No food after midnight.    You may have clear liquid until 7:30 AM.    At 7:00 AM drink pre surgery drink.   Nothing by mouth after 7:30 AM.   CLEAR LIQUID DIET   Foods Allowed                                                                     Foods Excluded  Coffee and tea, regular and decaf                             liquids that you cannot  Plain Jell-O any favor except red or purple                                           see through such as: Fruit ices (not with fruit pulp)                                     milk, soups, orange juice  Iced Popsicles                                    All solid food Carbonated beverages, regular and diet                                    Cranberry, grape and apple juices Sports drinks like Gatorade Lightly seasoned clear broth or consume(fat free) Sugar     BRUSH YOUR TEETH MORNING OF SURGERY AND RINSE YOUR MOUTH OUT, NO CHEWING GUM CANDY OR MINTS.     Take these medicines the morning of surgery with A SIP OF WATER: none                                You may not have any metal on your body including hair pins and              piercings  Do not wear jewelry, make-up, lotions, powders or perfumes, deodorant  Do not wear nail polish on your fingernails.  Do not shave  48 hours prior to surgery.                 Do not bring valuables to the  hospital. [place].  Contacts, dentures or bridgework may not be worn into surgery.     Patients discharged the day of surgery will not be allowed to drive home.  IF YOU ARE HAVING SURGERY AND GOING HOME THE SAME DAY, YOU MUST HAVE AN ADULT TO DRIVE YOU HOME AND BE WITH YOU FOR 24 HOURS. YOU MAY GO HOME BY TAXI OR UBER OR ORTHERWISE, BUT AN ADULT MUST ACCOMPANY YOU HOME AND STAY WITH YOU FOR 24 HOURS.  Name and phone number of your driver:  Special Instructions: N/A              Please read over the following fact sheets you were given: _____________________________________________________________________             [facility] - Preparing for Surgery Before surgery, you can play an important role.  Because skin is not sterile, your skin needs to be as free of germs as possible.  You can reduce the number of germs on your skin by washing with CHG (chlorahexidine gluconate) soap before surgery.  CHG is an antiseptic cleaner which kills germs and bonds with the skin to continue killing germs even after washing. Please DO NOT use if you have an allergy to CHG or antibacterial soaps.  If your skin becomes reddened/irritated stop using the CHG and inform your nurse when you arrive at Short Stay. Do not shave (including legs and underarms) for at least 48 hours prior to the first CHG shower.   Please follow these instructions carefully:  1.  Shower with CHG Soap the night before surgery and the  morning of Surgery.  2.  If you choose to wash your hair, wash your hair first as usual with your  normal  shampoo.  3.  After you shampoo, rinse your hair and body thoroughly to remove the  shampoo.                            4.  Use CHG as you would any other liquid soap.  You can apply chg directly  to the skin and wash                       Gently with a scrungie or clean washcloth.  5.  Apply the CHG Soap to your body ONLY FROM THE NECK DOWN.   Do not  use on face/ open                           Wound or open sores. Avoid contact with eyes, ears mouth and genitals (private parts).                       Wash face,  Genitals (private parts) with your normal soap.             6.  Wash thoroughly, paying special attention to the area where your surgery  will be performed.  7.  Thoroughly rinse your body with warm water from the neck down.  8.  DO  NOT shower/wash with your normal soap after using and rinsing off  the CHG Soap.                9.  Pat yourself dry with a clean towel.            10.  Wear clean pajamas.            11.  Place clean sheets on your bed the night of your first shower and do not  sleep with pets. Day of Surgery : Do not apply any lotions/deodorants the morning of surgery.  Please wear clean clothes to the hospital/surgery center.  FAILURE TO FOLLOW THESE INSTRUCTIONS MAY RESULT IN THE CANCELLATION OF YOUR SURGERY PATIENT SIGNATURE_________________________________  NURSE SIGNATURE__________________________________  ________________________________________________________________________   [name]  An incentive spirometer is a tool that can help keep your lungs clear and active. This tool measures how well you are filling your lungs with each breath. Taking long deep breaths may help reverse or decrease the chance of developing breathing (pulmonary) problems (especially infection) following: A long period of time when you are unable to move or be active. BEFORE THE PROCEDURE  If the spirometer includes an indicator to show your best effort, your nurse or respiratory therapist will set it to a desired goal. If possible, sit up straight or lean slightly forward. Try not to slouch. Hold the incentive spirometer in an upright position. INSTRUCTIONS FOR USE  Sit on the edge of your bed if possible, or sit up as far as you can in bed or on a chair. Hold the incentive spirometer in an upright position. Breathe  out normally. Place the mouthpiece in your mouth and seal your lips tightly around it. Breathe in slowly and as deeply as possible, raising the piston or the ball toward the top of the column. Hold your breath for 3-5 seconds or for as long as possible. Allow the piston or ball to fall to the bottom of the column. Remove the mouthpiece from your mouth and breathe out normally. Rest for a few seconds and repeat Steps 1 through 7 at least 10 times every 1-2 hours when you are awake. Take your time and take a few normal breaths between deep breaths. The spirometer may include an indicator to show your best effort. Use the indicator as a goal to work toward during each repetition. After each set of 10 deep breaths, practice coughing to be sure your lungs are clear. If you have an incision (the cut made at the time of surgery), support your incision when coughing by placing a pillow or rolled up towels firmly against it. Once you are able to get out of bed, walk around indoors and cough well. You may stop using the incentive spirometer when instructed by your caregiver.  RISKS AND COMPLICATIONS Take your time so you do not get dizzy or light-headed. If you are in pain, you may need to take or ask for pain medication before doing incentive spirometry. It is harder to take a deep breath if you are having pain. AFTER USE Rest and breathe slowly and easily. It can be helpful to keep track of a log of your progress. Your caregiver can provide you with a simple table to help with this. If you are using the spirometer at home, follow these instructions: [place] IF:  You are having difficultly using the spirometer. You have trouble using the spirometer as often as instructed. Your pain medication is not giving  enough relief while using the spirometer. You develop fever of 100.5 F (38.1 C) or higher. SEEK IMMEDIATE MEDICAL CARE IF:  You cough up bloody sputum that had not been present  before. You develop fever of 102 F (38.9 C) or greater. You develop worsening pain at or near the incision site. MAKE SURE YOU:  Understand these instructions. Will watch your condition. Will get help right away if you are not doing well or get worse. Document Released: 03/20/2007 Document Revised: 01/30/2012 Document Reviewed: 05/21/2007 [facility] Patient Information 2014 [place], [place].   ________________________________________________________________________

## 2021-11-18 ENCOUNTER — Other Ambulatory Visit: Payer: Self-pay

## 2021-11-18 ENCOUNTER — Encounter (HOSPITAL_COMMUNITY): Payer: Self-pay

## 2021-11-18 ENCOUNTER — Encounter (HOSPITAL_COMMUNITY)
Admission: RE | Admit: 2021-11-18 | Discharge: 2021-11-18 | Disposition: A | Discharge status: Home / Self Care | Payer: Medicare Other | Source: Ambulatory Visit | Attending: Orthopedic Surgery | Admitting: Orthopedic Surgery

## 2021-11-18 VITALS — BP 135/91 | HR 79 | Temp 98.4°F | Resp 18 | Ht 64.0 in | Wt 160.0 lb

## 2021-11-18 DIAGNOSIS — M1612 Unilateral primary osteoarthritis, left hip: Secondary | ICD-10-CM

## 2021-11-18 DIAGNOSIS — Z01818 Encounter for other preprocedural examination: Secondary | ICD-10-CM | POA: Diagnosis not present

## 2021-11-18 HISTORY — DX: Personal history of urinary calculi: Z87.442

## 2021-11-18 HISTORY — DX: Unspecified osteoarthritis, unspecified site: M19.90

## 2021-11-18 LAB — SURGICAL PCR SCREEN
MRSA, PCR: NEGATIVE
Staphylococcus aureus: NEGATIVE

## 2021-11-18 NOTE — Progress Notes (Signed)
COVID test- NA  PCP - Cathi Roan PA Cardiologist - none  Chest x-ray - no EKG - 11/18/21-chart Stress Test - no ECHO - no Cardiac Cath - NA Pacemaker/ICD device last checked:NA  Sleep Study - no CPAP -   Fasting Blood Sugar - NA Checks Blood Sugar _____ times a day  Blood Thinner Instructions:NA Aspirin Instructions: Last Dose:  Anesthesia review: no  Patient denies shortness of breath, fever, cough and chest pain at PAT appointment Pt is using a walker due to her hip pain. She denies SOB with activities. She would not like blood unless it is absolutely to keep her from dying.  Her labs are on the chart  Patient verbalized understanding of instructions that were given to them at the PAT appointment. Patient was also instructed that they will need to review over the PAT instructions again at home before surgery. yes

## 2021-11-23 NOTE — Anesthesia Preprocedure Evaluation (Addendum)
Anesthesia Evaluation  Patient identified by MRN, date of birth, ID band Patient awake    Reviewed: Allergy & Precautions, NPO status , Patient's Chart, lab work & pertinent test results  Airway Mallampati: II  TM Distance: >3 FB Neck ROM: Full    Dental no notable dental hx. (+) Upper Dentures, Partial Lower,    Pulmonary former smoker,    Pulmonary exam normal breath sounds clear to auscultation       Cardiovascular hypertension, Pt. on medications Normal cardiovascular exam Rhythm:Regular Rate:Normal     Neuro/Psych Anxiety    GI/Hepatic GERD  ,  Endo/Other  negative endocrine ROS  Renal/GU Renal disease     Musculoskeletal  (+) Arthritis ,   Abdominal   Peds  Hematology   Anesthesia Other Findings All: mobic  Reproductive/Obstetrics                           Anesthesia Physical Anesthesia Plan  ASA: 2  Anesthesia Plan: Spinal   Post-op Pain Management: Minimal or no pain anticipated   Induction:   PONV Risk Score and Plan: Treatment may vary due to age or medical condition and Ondansetron  Airway Management Planned: Natural Airway and Simple Face Mask  Additional Equipment: None  Intra-op Plan:   Post-operative Plan:   Informed Consent: I have reviewed the patients History and Physical, chart, labs and discussed the procedure including the risks, benefits and alternatives for the proposed anesthesia with the patient or authorized representative who has indicated his/her understanding and acceptance.     Dental advisory given  Plan Discussed with: CRNA and Anesthesiologist  Anesthesia Plan Comments: (SP)        Anesthesia Quick Evaluation

## 2021-11-24 ENCOUNTER — Observation Stay (HOSPITAL_COMMUNITY)
Admission: RE | Admit: 2021-11-24 | Discharge: 2021-11-26 | Disposition: A | Discharge status: Home / Self Care | Payer: Medicare Other | Attending: Orthopedic Surgery | Admitting: Orthopedic Surgery

## 2021-11-24 ENCOUNTER — Ambulatory Visit (HOSPITAL_COMMUNITY): Payer: Medicare Other

## 2021-11-24 ENCOUNTER — Encounter (HOSPITAL_COMMUNITY): Payer: Self-pay | Admitting: Orthopedic Surgery

## 2021-11-24 ENCOUNTER — Ambulatory Visit (HOSPITAL_COMMUNITY): Payer: Medicare Other | Admitting: Anesthesiology

## 2021-11-24 ENCOUNTER — Encounter (HOSPITAL_COMMUNITY)
Admission: RE | Disposition: A | Discharge status: Home / Self Care | Payer: Self-pay | Source: Home / Self Care | Attending: Orthopedic Surgery

## 2021-11-24 ENCOUNTER — Other Ambulatory Visit: Payer: Self-pay

## 2021-11-24 ENCOUNTER — Ambulatory Visit (HOSPITAL_COMMUNITY): Payer: Medicare Other | Admitting: Physician Assistant

## 2021-11-24 DIAGNOSIS — M1612 Unilateral primary osteoarthritis, left hip: Principal | ICD-10-CM | POA: Insufficient documentation

## 2021-11-24 DIAGNOSIS — Z471 Aftercare following joint replacement surgery: Secondary | ICD-10-CM | POA: Diagnosis not present

## 2021-11-24 DIAGNOSIS — Z96642 Presence of left artificial hip joint: Secondary | ICD-10-CM | POA: Diagnosis not present

## 2021-11-24 DIAGNOSIS — Z87891 Personal history of nicotine dependence: Secondary | ICD-10-CM | POA: Diagnosis not present

## 2021-11-24 DIAGNOSIS — Z7982 Long term (current) use of aspirin: Secondary | ICD-10-CM | POA: Diagnosis not present

## 2021-11-24 DIAGNOSIS — Z79899 Other long term (current) drug therapy: Secondary | ICD-10-CM | POA: Insufficient documentation

## 2021-11-24 DIAGNOSIS — I10 Essential (primary) hypertension: Secondary | ICD-10-CM | POA: Insufficient documentation

## 2021-11-24 DIAGNOSIS — Z96649 Presence of unspecified artificial hip joint: Secondary | ICD-10-CM

## 2021-11-24 DIAGNOSIS — M169 Osteoarthritis of hip, unspecified: Secondary | ICD-10-CM | POA: Diagnosis present

## 2021-11-24 DIAGNOSIS — Z419 Encounter for procedure for purposes other than remedying health state, unspecified: Secondary | ICD-10-CM

## 2021-11-24 HISTORY — PX: TOTAL HIP ARTHROPLASTY: SHX124

## 2021-11-24 LAB — TYPE AND SCREEN
ABO/RH(D): A POS
Antibody Screen: NEGATIVE

## 2021-11-24 LAB — ABO/RH: ABO/RH(D): A POS

## 2021-11-24 SURGERY — ARTHROPLASTY, HIP, TOTAL, ANTERIOR APPROACH
Anesthesia: Spinal | Site: Hip | Laterality: Left

## 2021-11-24 MED ORDER — POVIDONE-IODINE 10 % EX SWAB
2.0000 "application " | Freq: Once | CUTANEOUS | Status: AC
  Administered 2021-11-24: 2 via TOPICAL

## 2021-11-24 MED ORDER — LOPERAMIDE HCL 1 MG/7.5ML PO SUSP
2.0000 mg | ORAL | Status: DC | PRN
  Filled 2021-11-24: qty 30

## 2021-11-24 MED ORDER — PHENYLEPHRINE 40 MCG/ML (10ML) SYRINGE FOR IV PUSH (FOR BLOOD PRESSURE SUPPORT)
PREFILLED_SYRINGE | INTRAVENOUS | Status: DC | PRN
  Administered 2021-11-24: 120 ug via INTRAVENOUS

## 2021-11-24 MED ORDER — TRANEXAMIC ACID-NACL 1000-0.7 MG/100ML-% IV SOLN
1000.0000 mg | INTRAVENOUS | Status: AC
  Administered 2021-11-24: 1000 mg via INTRAVENOUS
  Filled 2021-11-24: qty 100

## 2021-11-24 MED ORDER — BUPIVACAINE HCL (PF) 0.25 % IJ SOLN
INTRAMUSCULAR | Status: AC
  Filled 2021-11-24: qty 30

## 2021-11-24 MED ORDER — ONDANSETRON HCL 4 MG/2ML IJ SOLN
INTRAMUSCULAR | Status: AC
  Filled 2021-11-24: qty 2

## 2021-11-24 MED ORDER — POLYETHYLENE GLYCOL 3350 17 G PO PACK
17.0000 g | PACK | Freq: Every day | ORAL | Status: DC | PRN
  Administered 2021-11-26: 17 g via ORAL
  Filled 2021-11-24: qty 1

## 2021-11-24 MED ORDER — DEXAMETHASONE SODIUM PHOSPHATE 10 MG/ML IJ SOLN
INTRAMUSCULAR | Status: AC
  Filled 2021-11-24: qty 1

## 2021-11-24 MED ORDER — PHENOL 1.4 % MT LIQD: 1.0000 | OROMUCOSAL | Status: DC | PRN

## 2021-11-24 MED ORDER — ORAL CARE MOUTH RINSE: 15.0000 mL | Freq: Once | OROMUCOSAL | Status: AC

## 2021-11-24 MED ORDER — MENTHOL 3 MG MT LOZG: 1.0000 | LOZENGE | OROMUCOSAL | Status: DC | PRN

## 2021-11-24 MED ORDER — BUPIVACAINE IN DEXTROSE 0.75-8.25 % IT SOLN
INTRATHECAL | Status: DC | PRN
  Administered 2021-11-24: 1.6 mL via INTRATHECAL

## 2021-11-24 MED ORDER — TRAMADOL HCL 50 MG PO TABS: 50.0000 mg | ORAL_TABLET | Freq: Four times a day (QID) | ORAL | 0 refills | Status: AC | PRN

## 2021-11-24 MED ORDER — BISMUTH SUBSALICYLATE 262 MG/15ML PO SUSP
30.0000 mL | Freq: Four times a day (QID) | ORAL | Status: DC | PRN
  Filled 2021-11-24: qty 236

## 2021-11-24 MED ORDER — ONDANSETRON HCL 4 MG PO TABS: 4.0000 mg | ORAL_TABLET | Freq: Four times a day (QID) | ORAL | Status: DC | PRN

## 2021-11-24 MED ORDER — OXYCODONE HCL 5 MG PO TABS
ORAL_TABLET | ORAL | Status: AC
  Administered 2021-11-24: 10 mg via ORAL
  Filled 2021-11-24: qty 2

## 2021-11-24 MED ORDER — ASPIRIN EC 325 MG PO TBEC
325.0000 mg | DELAYED_RELEASE_TABLET | Freq: Two times a day (BID) | ORAL | Status: DC
  Administered 2021-11-25 – 2021-11-26 (×3): 325 mg via ORAL
  Filled 2021-11-24 (×3): qty 1

## 2021-11-24 MED ORDER — CEFAZOLIN SODIUM-DEXTROSE 2-4 GM/100ML-% IV SOLN
2.0000 g | Freq: Four times a day (QID) | INTRAVENOUS | Status: AC
  Administered 2021-11-24 – 2021-11-25 (×2): 2 g via INTRAVENOUS
  Filled 2021-11-24: qty 100

## 2021-11-24 MED ORDER — DEXAMETHASONE SODIUM PHOSPHATE 10 MG/ML IJ SOLN
10.0000 mg | Freq: Once | INTRAMUSCULAR | Status: AC
  Administered 2021-11-25: 10 mg via INTRAVENOUS
  Filled 2021-11-24: qty 1

## 2021-11-24 MED ORDER — BUPIVACAINE-EPINEPHRINE (PF) 0.25% -1:200000 IJ SOLN
INTRAMUSCULAR | Status: AC
  Filled 2021-11-24: qty 30

## 2021-11-24 MED ORDER — DOCUSATE SODIUM 100 MG PO CAPS
100.0000 mg | ORAL_CAPSULE | Freq: Two times a day (BID) | ORAL | Status: DC
  Administered 2021-11-24 – 2021-11-26 (×4): 100 mg via ORAL
  Filled 2021-11-24 (×4): qty 1

## 2021-11-24 MED ORDER — CEFAZOLIN SODIUM-DEXTROSE 2-4 GM/100ML-% IV SOLN
INTRAVENOUS | Status: AC
  Filled 2021-11-24: qty 100

## 2021-11-24 MED ORDER — METHOCARBAMOL 500 MG PO TABS
ORAL_TABLET | ORAL | Status: AC
  Administered 2021-11-24: 500 mg via ORAL
  Filled 2021-11-24: qty 1

## 2021-11-24 MED ORDER — PROPOFOL 500 MG/50ML IV EMUL
INTRAVENOUS | Status: DC | PRN
  Administered 2021-11-24: 75 ug/kg/min via INTRAVENOUS

## 2021-11-24 MED ORDER — PHENYLEPHRINE HCL (PRESSORS) 10 MG/ML IV SOLN
INTRAVENOUS | Status: AC
  Filled 2021-11-24: qty 1

## 2021-11-24 MED ORDER — EPHEDRINE SULFATE-NACL 50-0.9 MG/10ML-% IV SOSY
PREFILLED_SYRINGE | INTRAVENOUS | Status: DC | PRN
  Administered 2021-11-24 (×2): 10 mg via INTRAVENOUS
  Administered 2021-11-24: 5 mg via INTRAVENOUS

## 2021-11-24 MED ORDER — BUPIVACAINE HCL 0.25 % IJ SOLN
INTRAMUSCULAR | Status: DC | PRN
  Administered 2021-11-24: 30 mL

## 2021-11-24 MED ORDER — PROPOFOL 10 MG/ML IV BOLUS
INTRAVENOUS | Status: DC | PRN
Start: 2021-11-24 — End: 2021-11-24
  Administered 2021-11-24 (×2): 20 mg via INTRAVENOUS

## 2021-11-24 MED ORDER — ACETAMINOPHEN 10 MG/ML IV SOLN: 1000.0000 mg | Freq: Once | INTRAVENOUS | Status: DC | PRN

## 2021-11-24 MED ORDER — CHLORHEXIDINE GLUCONATE 0.12 % MT SOLN
15.0000 mL | Freq: Once | OROMUCOSAL | Status: AC
  Administered 2021-11-24: 15 mL via OROMUCOSAL

## 2021-11-24 MED ORDER — ACETAMINOPHEN 10 MG/ML IV SOLN
1000.0000 mg | Freq: Once | INTRAVENOUS | Status: AC
  Administered 2021-11-24: 1000 mg via INTRAVENOUS
  Filled 2021-11-24: qty 100

## 2021-11-24 MED ORDER — ONDANSETRON HCL 4 MG/2ML IJ SOLN: 4.0000 mg | Freq: Four times a day (QID) | INTRAMUSCULAR | Status: DC | PRN

## 2021-11-24 MED ORDER — PHENYLEPHRINE 40 MCG/ML (10ML) SYRINGE FOR IV PUSH (FOR BLOOD PRESSURE SUPPORT)
PREFILLED_SYRINGE | INTRAVENOUS | Status: AC
  Filled 2021-11-24: qty 10

## 2021-11-24 MED ORDER — TRAMADOL HCL 50 MG PO TABS: 50.0000 mg | ORAL_TABLET | Freq: Four times a day (QID) | ORAL | Status: DC | PRN

## 2021-11-24 MED ORDER — PROPOFOL 1000 MG/100ML IV EMUL
INTRAVENOUS | Status: AC
  Filled 2021-11-24: qty 100

## 2021-11-24 MED ORDER — ONDANSETRON HCL 4 MG/2ML IJ SOLN: 4.0000 mg | Freq: Once | INTRAMUSCULAR | Status: DC | PRN

## 2021-11-24 MED ORDER — LACTATED RINGERS IV SOLN: INTRAVENOUS | Status: DC

## 2021-11-24 MED ORDER — DEXAMETHASONE SODIUM PHOSPHATE 10 MG/ML IJ SOLN
8.0000 mg | Freq: Once | INTRAMUSCULAR | Status: AC
  Administered 2021-11-24: 5 mg via INTRAVENOUS

## 2021-11-24 MED ORDER — ONDANSETRON HCL 4 MG/2ML IJ SOLN
INTRAMUSCULAR | Status: DC | PRN
Start: 2021-11-24 — End: 2021-11-24
  Administered 2021-11-24: 4 mg via INTRAVENOUS

## 2021-11-24 MED ORDER — LACTATED RINGERS IV BOLUS
500.0000 mL | Freq: Once | INTRAVENOUS | Status: AC
  Administered 2021-11-24: 500 mL via INTRAVENOUS

## 2021-11-24 MED ORDER — METOCLOPRAMIDE HCL 5 MG PO TABS: 5.0000 mg | ORAL_TABLET | Freq: Three times a day (TID) | ORAL | Status: DC | PRN

## 2021-11-24 MED ORDER — METOCLOPRAMIDE HCL 5 MG/ML IJ SOLN: 5.0000 mg | Freq: Three times a day (TID) | INTRAMUSCULAR | Status: DC | PRN

## 2021-11-24 MED ORDER — 0.9 % SODIUM CHLORIDE (POUR BTL) OPTIME
TOPICAL | Status: DC | PRN
  Administered 2021-11-24: 1000 mL

## 2021-11-24 MED ORDER — OXYCODONE HCL 5 MG PO TABS
5.0000 mg | ORAL_TABLET | ORAL | Status: DC | PRN
  Administered 2021-11-24 – 2021-11-25 (×2): 10 mg via ORAL
  Administered 2021-11-25 (×2): 5 mg via ORAL
  Administered 2021-11-25: 10 mg via ORAL
  Administered 2021-11-26 (×2): 5 mg via ORAL
  Filled 2021-11-24: qty 2
  Filled 2021-11-24 (×3): qty 1
  Filled 2021-11-24 (×2): qty 2
  Filled 2021-11-24: qty 1

## 2021-11-24 MED ORDER — SODIUM CHLORIDE 0.9 % IV SOLN: INTRAVENOUS | Status: DC

## 2021-11-24 MED ORDER — LACTATED RINGERS IV BOLUS: 250.0000 mL | Freq: Once | INTRAVENOUS | Status: DC

## 2021-11-24 MED ORDER — OXYCODONE HCL 5 MG PO TABS
5.0000 mg | ORAL_TABLET | Freq: Four times a day (QID) | ORAL | 0 refills | Status: AC | PRN
Start: 2021-11-24 — End: 2022-11-24

## 2021-11-24 MED ORDER — MORPHINE SULFATE (PF) 2 MG/ML IV SOLN
0.5000 mg | INTRAVENOUS | Status: DC | PRN
  Administered 2021-11-25: 1 mg via INTRAVENOUS
  Filled 2021-11-24: qty 1

## 2021-11-24 MED ORDER — ACETAMINOPHEN 325 MG PO TABS: 325.0000 mg | ORAL_TABLET | Freq: Four times a day (QID) | ORAL | Status: DC | PRN

## 2021-11-24 MED ORDER — METHOCARBAMOL 500 MG IVPB - SIMPLE MED
500.0000 mg | Freq: Four times a day (QID) | INTRAVENOUS | Status: DC | PRN
  Filled 2021-11-24: qty 50

## 2021-11-24 MED ORDER — METHOCARBAMOL 500 MG PO TABS: 500.0000 mg | ORAL_TABLET | Freq: Four times a day (QID) | ORAL | 0 refills | Status: DC | PRN

## 2021-11-24 MED ORDER — SIMETHICONE 80 MG PO CHEW
80.0000 mg | CHEWABLE_TABLET | Freq: Four times a day (QID) | ORAL | Status: DC | PRN
  Administered 2021-11-26: 80 mg via ORAL
  Filled 2021-11-24: qty 1

## 2021-11-24 MED ORDER — CEFAZOLIN SODIUM-DEXTROSE 2-4 GM/100ML-% IV SOLN
2.0000 g | INTRAVENOUS | Status: AC
  Administered 2021-11-24: 2 g via INTRAVENOUS
  Filled 2021-11-24: qty 100

## 2021-11-24 MED ORDER — LACTATED RINGERS IV BOLUS
250.0000 mL | Freq: Once | INTRAVENOUS | Status: AC
  Administered 2021-11-24: 250 mL via INTRAVENOUS

## 2021-11-24 MED ORDER — PHENYLEPHRINE HCL-NACL 20-0.9 MG/250ML-% IV SOLN
INTRAVENOUS | Status: DC | PRN
  Administered 2021-11-24: 50 ug/min via INTRAVENOUS

## 2021-11-24 MED ORDER — ASPIRIN EC 325 MG PO TBEC: 325.0000 mg | DELAYED_RELEASE_TABLET | Freq: Two times a day (BID) | ORAL | 0 refills | Status: AC

## 2021-11-24 MED ORDER — FENTANYL CITRATE PF 50 MCG/ML IJ SOSY: 25.0000 ug | PREFILLED_SYRINGE | INTRAMUSCULAR | Status: DC | PRN

## 2021-11-24 MED ORDER — WATER FOR IRRIGATION, STERILE IR SOLN
Status: DC | PRN
  Administered 2021-11-24: 2000 mL

## 2021-11-24 MED ORDER — BISACODYL 10 MG RE SUPP: 10.0000 mg | Freq: Every day | RECTAL | Status: DC | PRN

## 2021-11-24 MED ORDER — METHOCARBAMOL 500 MG PO TABS
500.0000 mg | ORAL_TABLET | Freq: Four times a day (QID) | ORAL | Status: DC | PRN
  Administered 2021-11-25 – 2021-11-26 (×2): 500 mg via ORAL
  Filled 2021-11-24 (×2): qty 1

## 2021-11-24 MED ORDER — PHENYLEPHRINE HCL-NACL 20-0.9 MG/250ML-% IV SOLN
INTRAVENOUS | Status: AC
  Filled 2021-11-24: qty 250

## 2021-11-24 SURGICAL SUPPLY — 44 items
BAG COUNTER SPONGE SURGICOUNT (BAG) IMPLANT
BAG DECANTER FOR FLEXI CONT (MISCELLANEOUS) IMPLANT
BAG SPEC THK2 15X12 ZIP CLS (MISCELLANEOUS)
BAG SPNG CNTER NS LX DISP (BAG)
BAG ZIPLOCK 12X15 (MISCELLANEOUS) IMPLANT
BLADE SAG 18X100X1.27 (BLADE) ×2 IMPLANT
COVER PERINEAL POST (MISCELLANEOUS) ×2 IMPLANT
COVER SURGICAL LIGHT HANDLE (MISCELLANEOUS) ×2 IMPLANT
CUP ACETBLR 48 OD SECTOR II (Hips) ×1 IMPLANT
DECANTER SPIKE VIAL GLASS SM (MISCELLANEOUS) ×2 IMPLANT
DRAPE FOOT SWITCH (DRAPES) ×2 IMPLANT
DRAPE STERI IOBAN 125X83 (DRAPES) ×2 IMPLANT
DRAPE U-SHAPE 47X51 STRL (DRAPES) ×4 IMPLANT
DRSG AQUACEL AG ADV 3.5X10 (GAUZE/BANDAGES/DRESSINGS) ×2 IMPLANT
DURAPREP 26ML APPLICATOR (WOUND CARE) ×2 IMPLANT
ELECT REM PT RETURN 15FT ADLT (MISCELLANEOUS) ×2 IMPLANT
GLOVE SRG 8 PF TXTR STRL LF DI (GLOVE) ×1 IMPLANT
GLOVE SURG ENC MOIS LTX SZ6.5 (GLOVE) ×2 IMPLANT
GLOVE SURG ENC MOIS LTX SZ7 (GLOVE) ×2 IMPLANT
GLOVE SURG ENC MOIS LTX SZ8 (GLOVE) ×4 IMPLANT
GLOVE SURG UNDER POLY LF SZ7 (GLOVE) ×2 IMPLANT
GLOVE SURG UNDER POLY LF SZ8 (GLOVE) ×2
GLOVE SURG UNDER POLY LF SZ8.5 (GLOVE) IMPLANT
GOWN STRL REUS W/TWL LRG LVL3 (GOWN DISPOSABLE) ×4 IMPLANT
GOWN STRL REUS W/TWL XL LVL3 (GOWN DISPOSABLE) IMPLANT
HEAD CERAMIC DELTA 28 P1.5 HIP (Head) ×1 IMPLANT
HOLDER FOLEY CATH W/STRAP (MISCELLANEOUS) ×2 IMPLANT
KIT TURNOVER KIT A (KITS) IMPLANT
LINER ACET ALTRX NEUT +4X28X48 (Hips) ×1 IMPLANT
MANIFOLD NEPTUNE II (INSTRUMENTS) ×2 IMPLANT
PACK ANTERIOR HIP CUSTOM (KITS) ×2 IMPLANT
PENCIL SMOKE EVACUATOR COATED (MISCELLANEOUS) ×2 IMPLANT
SPONGE T-LAP 18X18 ~~LOC~~+RFID (SPONGE) ×6 IMPLANT
STEM FEMORAL SZ 6MM STD ACTIS (Stem) ×1 IMPLANT
STRIP CLOSURE SKIN 1/2X4 (GAUZE/BANDAGES/DRESSINGS) ×3 IMPLANT
SUT ETHIBOND NAB CT1 #1 30IN (SUTURE) ×2 IMPLANT
SUT MNCRL AB 4-0 PS2 18 (SUTURE) ×2 IMPLANT
SUT STRATAFIX 0 PDS 27 VIOLET (SUTURE) ×2
SUT VIC AB 2-0 CT1 27 (SUTURE) ×4
SUT VIC AB 2-0 CT1 TAPERPNT 27 (SUTURE) ×2 IMPLANT
SUTURE STRATFX 0 PDS 27 VIOLET (SUTURE) ×1 IMPLANT
SYR 50ML LL SCALE MARK (SYRINGE) IMPLANT
TRAY FOLEY MTR SLVR 16FR STAT (SET/KITS/TRAYS/PACK) ×2 IMPLANT
TUBE SUCTION HIGH CAP CLEAR NV (SUCTIONS) ×2 IMPLANT

## 2021-11-24 NOTE — Progress Notes (Signed)
Physical Therapy Treatment Patient Details Name: Robin Obrien MRN: [id number] DOB: 1951/06/18 Today's Date: 11/24/2021   History of Present Illness 71 yo female s/p L THA-DA 11/24/21. Hx of bil hip OA    PT Comments    2nd session in PACU. Pt reported 7/10 pain. She was able to walk ~7 feet with a RW. Distance limited by pain, fatigue. 1 instance of LE buckling during walk. Unfortunately, pt has been unable to meet PT goals/safely mobilize. Made RN aware that pt will need to remain overnight to continue working with therapy. Will progress activity as safely able.     Recommendations for follow up therapy are one component of a multi-disciplinary discharge planning process, led by the attending physician.  Recommendations may be updated based on patient status, additional functional criteria and insurance authorization.  Follow Up Recommendations  Follow physician's recommendations for discharge plan and follow up therapies (could benefit from HHPT)     Assistance Recommended at Discharge Frequent or constant Supervision/Assistance  Patient can return home with the following A lot of help with walking and/or transfers;Two people to help with walking and/or transfers   Equipment Recommendations  None recommended by PT    Recommendations for Other Services       Precautions / Restrictions Precautions Precautions: Fall Restrictions Weight Bearing Restrictions: No Other Position/Activity Restrictions: WBAT  Pt has limited hip ROM bilaterally    Mobility  Bed Mobility Overal bed mobility: Needs Assistance Bed Mobility: Supine to Sit     Supine to sit: Mod assist;HOB elevated Sit to supine: Min assist   General bed mobility comments: Assist for bil LEs (poor hip ROM bilaterally), trunk, and to scoot to EOB. Pt denied dizziness.    Transfers Overall transfer level: Needs assistance Equipment used: Rolling walker (2 wheels) Transfers: Sit to/from Stand Sit to Stand: Min  assist;+2 physical assistance;+2 safety/equipment;From elevated surface           General transfer comment: Assist to power up, stabilize, control descent. Cues for safety, technique, hand placement.    Ambulation/Gait Ambulation/Gait assistance: Min assist;+2 physical assistance;+2 safety/equipment Gait Distance (Feet): 7 Feet Assistive device: Rolling walker (2 wheels) Gait Pattern/deviations: Step-to pattern       General Gait Details: Assist to stabilize pt throughotu short distance. 1 instance of LE buckling but pt was able to continue. Cues for safety, technique, sequence, proper use of RW   Stairs             Wheelchair Mobility    Modified Rankin (Stroke Patients Only)       Balance Overall balance assessment: Needs assistance Sitting-balance support: Feet supported;Bilateral upper extremity supported Sitting balance-Leahy Scale: Good     Standing balance support: Bilateral upper extremity supported Standing balance-Leahy Scale: Poor                              Cognition Arousal/Alertness: Awake/alert Behavior During Therapy: WFL for tasks assessed/performed Overall Cognitive Status: Within Functional Limits for tasks assessed                                          Exercises Total Joint Exercises Ankle Circles/Pumps: AROM;Both;10 reps Quad Sets: AROM;Both;10 reps Heel Slides: AAROM;Left;10 reps Hip ABduction/ADduction: AAROM;Left;10 reps    General Comments        Pertinent Vitals/Pain Pain Assessment: 0-10  Pain Score: 7  Faces Pain Scale: Hurts a little bit Pain Location: L LE Pain Descriptors / Indicators: Discomfort;Sore;Aching Pain Intervention(s): Limited activity within patient's tolerance;Monitored during session;Repositioned;Ice applied    Home Living Family/patient expects to be discharged to:: Private residence Living Arrangements: Children Available Help at Discharge: Family Type of Home:  House Home Access: Stairs to enter Entrance Stairs-Rails: None Entrance Stairs-Number of Steps: 2   Home Layout: One level Home Equipment: Conservation officer, nature (2 wheels);Cane - single point      Prior Function            PT Goals (current goals can now be found in the care plan section) Acute Rehab PT Goals Patient Stated Goal: to regain independence and PLOF PT Goal Formulation: With patient Time For Goal Achievement: 12/08/21 Potential to Achieve Goals: Good Progress towards PT goals: Progressing toward goals    Frequency    7X/week      PT Plan Current plan remains appropriate    Co-evaluation              AM-PAC PT "6 Clicks" Mobility   Outcome Measure  Help needed turning from your back to your side while in a flat bed without using bedrails?: A Lot Help needed moving from lying on your back to sitting on the side of a flat bed without using bedrails?: A Lot Help needed moving to and from a bed to a chair (including a wheelchair)?: A Lot Help needed standing up from a chair using your arms (e.g., wheelchair or bedside chair)?: A Lot Help needed to walk in hospital room?: Total Help needed climbing 3-5 steps with a railing? : Total 6 Click Score: 10    End of Session Equipment Utilized During Treatment: Gait belt Activity Tolerance: Patient limited by fatigue;Patient limited by pain Patient left: in chair;with call bell/phone within reach   PT Visit Diagnosis: Other abnormalities of gait and mobility (R26.89);Pain Pain - Right/Left: Left Pain - part of body: Hip     Time: 1611-1630 PT Time Calculation (min) (ACUTE ONLY): 19 min  Charges:  $Gait Training: 8-22 mins                         Doreatha Massed, PT Acute Rehabilitation  Office: [phone] Pager: [phone]

## 2021-11-24 NOTE — Anesthesia Postprocedure Evaluation (Signed)
Anesthesia Post Note  Patient: Robin Obrien  Procedure(s) Performed: TOTAL HIP ARTHROPLASTY ANTERIOR APPROACH (Left: Hip)     Patient location during evaluation: Nursing Unit Anesthesia Type: Spinal Level of consciousness: oriented and awake and alert Pain management: pain level controlled Vital Signs Assessment: post-procedure vital signs reviewed and stable Respiratory status: spontaneous breathing and respiratory function stable Cardiovascular status: blood pressure returned to baseline and stable Postop Assessment: no headache, no backache, no apparent nausea or vomiting and patient able to bend at knees Anesthetic complications: no   No notable events documented.  Last Vitals:  Vitals:   11/24/21 1300 11/24/21 1400  BP: 92/66 99/67  Pulse:    Resp:    Temp:    SpO2:      Last Pain:  Vitals:   11/24/21 1300  TempSrc:   PainSc: 0-No pain                 Barnet Glasgow

## 2021-11-24 NOTE — Care Plan (Signed)
Ortho Bundle Case Management Note  Patient Details  Name: Robin Obrien MRN: [id number] Date of Birth: 12-17-1950                  L THA on 11-24-21 DCP: Home with dtr. DME: No needs, has a RW PT: HEP   DME Arranged:  N/A DME Agency:     HH Arranged:    [place] Agency:     Additional Comments: Please contact me with any questions of if this plan should need to change.  Marianne Sofia, RN,CCM [facility]  [phone] 11/24/2021, 10:47 AM

## 2021-11-24 NOTE — Interval H&P Note (Signed)
History and Physical Interval Note:  11/24/2021 8:15 AM  Robin Obrien  has presented today for surgery, with the diagnosis of left hip osteoarthritis.  The various methods of treatment have been discussed with the patient and family. After consideration of risks, benefits and other options for treatment, the patient has consented to  Procedure(s): TOTAL HIP ARTHROPLASTY ANTERIOR APPROACH (Left) as a surgical intervention.  The patient's history has been reviewed, patient examined, no change in status, stable for surgery.  I have reviewed the patient's chart and labs.  Questions were answered to the patient's satisfaction.     Pilar Plate Carmyn Hamm

## 2021-11-24 NOTE — Evaluation (Addendum)
Physical Therapy Evaluation Patient Details Name: Robin Obrien MRN: [id number] DOB: Mar 25, 1951 Today's Date: 11/24/2021  History of Present Illness  71 yo female s/p L THA-DA 11/24/21. Hx of bil hip OA  Clinical Impression  On eval, pt required Mod A for bed mobility. 2 attempts to stand however pt was unable 2* LE buckling. Assisted pt back onto bed and into supine. Unsure if pt will be safe to d/c home on today. If able, will check back a 2nd time to see if mobility has improved.        Recommendations for follow up therapy are one component of a multi-disciplinary discharge planning process, led by the attending physician.  Recommendations may be updated based on patient status, additional functional criteria and insurance authorization.  Follow Up Recommendations Follow physician's recommendations for discharge plan and follow up therapies    Assistance Recommended at Discharge Frequent or constant Supervision/Assistance  Patient can return home with the following  A lot of help with walking and/or transfers;A lot of help with bathing/dressing/bathroom;Help with stairs or ramp for entrance    Equipment Recommendations None recommended by PT  Recommendations for Other Services       Functional Status Assessment Patient has had a recent decline in their functional status and demonstrates the ability to make significant improvements in function in a reasonable and predictable amount of time.     Precautions / Restrictions Precautions Precautions: Fall Restrictions Weight Bearing Restrictions: No Other Position/Activity Restrictions: WBAT      Mobility  Bed Mobility Overal bed mobility: Needs Assistance Bed Mobility: Supine to Sit;Sit to Supine     Supine to sit: Mod assist;HOB elevated Sit to supine: Min assist   General bed mobility comments: Assist for bil LEs, trunk, and to scoot to EOB. Pt denied dizziness. BP improved 133/75.    Transfers Overall transfer level:  Needs assistance Equipment used: Rolling walker (2 wheels) Transfers: Sit to/from Stand Sit to Stand: Max assist;From elevated surface           General transfer comment: 2 attempts to stand. Pt unable to safely stand with LEs buckling. Cues for safety, technique, hand placement. Deferred further mobility and assisted pt back to bed.    Ambulation/Gait                  Stairs            Wheelchair Mobility    Modified Rankin (Stroke Patients Only)       Balance Overall balance assessment: Needs assistance Sitting-balance support: Feet supported;Bilateral upper extremity supported Sitting balance-Leahy Scale: Good     Standing balance support: Bilateral upper extremity supported Standing balance-Leahy Scale: Zero                               Pertinent Vitals/Pain Pain Assessment: Faces Faces Pain Scale: Hurts a little bit Pain Location: L LE Pain Descriptors / Indicators: Numbness;Discomfort Pain Intervention(s): Limited activity within patient's tolerance;Monitored during session;Repositioned    Home Living Family/patient expects to be discharged to:: Private residence Living Arrangements: Children Available Help at Discharge: Family Type of Home: House Home Access: Stairs to enter Entrance Stairs-Rails: None Entrance Stairs-Number of Steps: 2   Home Layout: One level Home Equipment: Conservation officer, nature (2 wheels);Cane - single point      Prior Function Prior Level of Function : Independent/Modified Independent             Mobility  Comments: using RW for ambulation       Hand Dominance        Extremity/Trunk Assessment   Upper Extremity Assessment Upper Extremity Assessment: Overall WFL for tasks assessed    Lower Extremity Assessment Lower Extremity Assessment: Generalized weakness. Limited hip ROM bilaterally. Has to come back to have R hip replaced.     Cervical / Trunk Assessment Cervical / Trunk Assessment:  Normal  Communication   Communication: No difficulties  Cognition Arousal/Alertness: Awake/alert Behavior During Therapy: WFL for tasks assessed/performed Overall Cognitive Status: Within Functional Limits for tasks assessed                                          General Comments      Exercises Total Joint Exercises Ankle Circles/Pumps: AROM;Both;10 reps Quad Sets: AROM;Both;10 reps Heel Slides: AAROM;Left;10 reps Hip ABduction/ADduction: AAROM;Left;10 reps   Assessment/Plan    PT Assessment Patient needs continued PT services  PT Problem List Decreased strength;Decreased mobility;Decreased range of motion;Decreased activity tolerance;Decreased balance;Decreased knowledge of use of DME;Pain       PT Treatment Interventions DME instruction;Gait training;Therapeutic activities;Therapeutic exercise;Patient/family education;Balance training;Functional mobility training;Stair training    PT Goals (Current goals can be found in the Care Plan section)  Acute Rehab PT Goals Patient Stated Goal: to regain independence and PLOF PT Goal Formulation: With patient Time For Goal Achievement: 12/08/21 Potential to Achieve Goals: Good    Frequency 7X/week     Co-evaluation               AM-PAC PT "6 Clicks" Mobility  Outcome Measure Help needed turning from your back to your side while in a flat bed without using bedrails?: A Lot Help needed moving from lying on your back to sitting on the side of a flat bed without using bedrails?: A Lot Help needed moving to and from a bed to a chair (including a wheelchair)?: Total Help needed standing up from a chair using your arms (e.g., wheelchair or bedside chair)?: Total Help needed to walk in hospital room?: Total Help needed climbing 3-5 steps with a railing? : Total 6 Click Score: 8    End of Session Equipment Utilized During Treatment: Gait belt Activity Tolerance: Patient tolerated treatment well Patient  left: in bed;with call bell/phone within reach   PT Visit Diagnosis: Other abnormalities of gait and mobility (R26.89)    Time: 6301-6010 PT Time Calculation (min) (ACUTE ONLY): 21 min   Charges:   PT Evaluation $PT Eval Low Complexity: [place], PT Acute Rehabilitation  Office: [phone] Pager: [phone]

## 2021-11-24 NOTE — Op Note (Signed)
OPERATIVE REPORT- TOTAL HIP ARTHROPLASTY   PREOPERATIVE DIAGNOSIS: Osteoarthritis of the Left hip.   POSTOPERATIVE DIAGNOSIS: Osteoarthritis of the Left  hip.   PROCEDURE: Left total hip arthroplasty, anterior approach.   SURGEON: Gaynelle Arabian, MD   ASSISTANT: Theresa Duty, PA-C  ANESTHESIA:  Spinal  ESTIMATED BLOOD LOSS:-200 mL    DRAINS: Hemovac x1.   COMPLICATIONS: None   CONDITION: PACU - hemodynamically stable.   BRIEF CLINICAL NOTE: Robin Obrien is a 71 y.o. female who has advanced end-  stage arthritis of their Left  hip with progressively worsening pain and  dysfunction.The patient has failed nonoperative management and presents for  total hip arthroplasty.   PROCEDURE IN DETAIL: After successful administration of spinal  anesthetic, the traction boots for the [facility] bed were placed on both  feet and the patient was placed onto the [facility] bed, boots placed into the leg  holders. The Left hip was then isolated from the perineum with plastic  drapes and prepped and draped in the usual sterile fashion. ASIS and  greater trochanter were marked and a oblique incision was made, starting  at about 1 cm lateral and 2 cm distal to the ASIS and coursing towards  the anterior cortex of the femur. The skin was cut with a 10 blade  through subcutaneous tissue to the level of the fascia overlying the  tensor fascia lata muscle. The fascia was then incised in line with the  incision at the junction of the anterior third and posterior 2/3rd. The  muscle was teased off the fascia and then the interval between the TFL  and the rectus was developed. The Hohmann retractor was then placed at  the top of the femoral neck over the capsule. The vessels overlying the  capsule were cauterized and the fat on top of the capsule was removed.  A Hohmann retractor was then placed anterior underneath the rectus  femoris to give exposure to the entire anterior capsule. A T-shaped   capsulotomy was performed. The edges were tagged and the femoral head  was identified.       Osteophytes are removed off the superior acetabulum.  The femoral neck was then cut in situ with an oscillating saw. Traction  was then applied to the left lower extremity utilizing the [facility]  traction. The femoral head was then removed. Retractors were placed  around the acetabulum and then circumferential removal of the labrum was  performed. Osteophytes were also removed. Reaming starts at 45 mm to  medialize and  Increased in 2 mm increments to 47 mm. We reamed in  approximately 40 degrees of abduction, 20 degrees anteversion. A 48 mm  pinnacle acetabular shell was then impacted in anatomic position under  fluoroscopic guidance with excellent purchase. We did not need to place  any additional dome screws. A 28 mm neutral + 4 marathon liner was then  placed into the acetabular shell.       The femoral lift was then placed along the lateral aspect of the femur  just distal to the vastus ridge. The leg was  externally rotated and capsule  was stripped off the inferior aspect of the femoral neck down to the  level of the lesser trochanter, this was done with electrocautery. The femur was lifted after this was performed. The  leg was then placed in an extended and adducted position essentially delivering the femur. We also removed the capsule superiorly and the piriformis from the piriformis fossa  to gain excellent exposure of the  proximal femur. Rongeur was used to remove some cancellous bone to get  into the lateral portion of the proximal femur for placement of the  initial starter reamer. The starter broaches was placed  the starter broach  and was shown to go down the center of the canal. Broaching  with the Actis system was then performed starting at size 0  coursing  Up to size 6. A size 6 had excellent torsional and rotational  and axial stability. The trial standard offset neck was then  placed  with a 28 + 1.5 trial head. The hip was then reduced. We confirmed that  the stem was in the canal both on AP and lateral x-rays. It also has excellent sizing. The hip was reduced with outstanding stability through full extension and full external rotation.. AP pelvis was taken and the leg lengths were measured and found to be equal. Hip was then dislocated again and the femoral head and neck removed. The  femoral broach was removed. Size 6 Actis stem with a standard offset  neck was then impacted into the femur following native anteversion. Has  excellent purchase in the canal. Excellent torsional and rotational and  axial stability. It is confirmed to be in the canal on AP and lateral  fluoroscopic views. The 28 + 1.5 ceramic head was placed and the hip  reduced with outstanding stability. Again AP pelvis was taken and it  confirmed that the leg lengths were equal. The wound was then copiously  irrigated with saline solution and the capsule reattached and repaired  with Ethibond suture. 30 ml of .25% Bupivicaine was  injected into the capsule and into the edge of the tensor fascia lata as well as subcutaneous tissue. The fascia overlying the tensor fascia lata was then closed with a running #1 V-Loc. Subcu was closed with interrupted 2-0 Vicryl and subcuticular running 4-0 Monocryl. Incision was cleaned  and dried. Steri-Strips and a bulky sterile dressing applied. The patient was awakened and transported to  recovery in stable condition.        Please note that a surgical assistant was a medical necessity for this procedure to perform it in a safe and expeditious manner. Assistant was necessary to provide appropriate retraction of vital neurovascular structures and to prevent femoral fracture and allow for anatomic placement of the prosthesis.  Gaynelle Arabian, M.D.

## 2021-11-24 NOTE — Transfer of Care (Signed)
Immediate Anesthesia Transfer of Care Note  Patient: Robin Obrien  Procedure(s) Performed: TOTAL HIP ARTHROPLASTY ANTERIOR APPROACH (Left: Hip)  Patient Location: PACU  Anesthesia Type:Spinal  Level of Consciousness: awake, alert  and oriented  Airway & Oxygen Therapy: Patient Spontanous Breathing and Patient connected to face mask  Post-op Assessment: Report given to RN and Post -op Vital signs reviewed and stable  Post vital signs: Reviewed and stable  Last Vitals:  Vitals Value Taken Time  BP 91/60 11/24/21 1130  Temp 36.4 C 11/24/21 1106  Pulse 61 11/24/21 1131  Resp 19 11/24/21 1132  SpO2 95 % 11/24/21 1131  Vitals shown include unvalidated device data.  Last Pain:  Vitals:   11/24/21 1106  TempSrc:   PainSc: 0-No pain      Patients Stated Pain Goal: 3 (12/92/90 9030)  Complications: No notable events documented.

## 2021-11-24 NOTE — Anesthesia Procedure Notes (Signed)
Spinal  Patient location during procedure: OR Start time: 11/24/2021 9:34 AM End time: 11/24/2021 9:34 AM Reason for block: surgical anesthesia Staffing Performed: resident/CRNA  Resident/CRNA: Rosaland Lao, CRNA Preanesthetic Checklist Completed: patient identified, IV checked, site marked, risks and benefits discussed, surgical consent, monitors and equipment checked, pre-op evaluation and timeout performed Spinal Block Patient position: sitting Prep: DuraPrep Patient monitoring: heart rate, cardiac monitor, continuous pulse ox and blood pressure Approach: midline Location: L3-4 Injection technique: single-shot Needle Needle type: Pencan  Needle gauge: 24 G Needle length: 10 cm Assessment Sensory level: T4 Events: CSF return

## 2021-11-24 NOTE — Discharge Instructions (Signed)
°Frank Aluisio, MD °Total Joint Specialist °[facility] °[address] °[place], [place] [address] °[phone] ° °ANTERIOR APPROACH TOTAL HIP REPLACEMENT POSTOPERATIVE DIRECTIONS ° ° ° ° °Hip Rehabilitation, Guidelines Following Surgery  °The results of a hip operation are greatly improved after range of motion and muscle strengthening exercises. Follow all safety measures which are given to protect your hip. If any of these exercises cause increased pain or swelling in your joint, decrease the amount until you are comfortable again. Then slowly increase the exercises. Call your caregiver if you have problems or questions.  ° °BLOOD CLOT PREVENTION °Take a 325 mg Aspirin two times a day for three weeks following surgery. Then take an 81 mg Aspirin once a day for three weeks. Then discontinue Aspirin. °You may resume your vitamins/supplements upon discharge from the hospital. °Do not take any NSAIDs (Advil, Aleve, Ibuprofen, Meloxicam, etc.) until you have discontinued the 325 mg Aspirin. ° °HOME CARE INSTRUCTIONS  °Remove items at home which could result in a fall. This includes throw rugs or furniture in walking pathways.  °ICE to the affected hip as frequently as 20-30 minutes an hour and then as needed for pain and swelling. Continue to use ice on the hip for pain and swelling from surgery. You may notice swelling that will progress down to the foot and ankle. This is normal after surgery. Elevate the leg when you are not up walking on it.   °Continue to use the breathing machine which will help keep your temperature down.  It is common for your temperature to cycle up and down following surgery, especially at night when you are not up moving around and exerting yourself.  The breathing machine keeps your lungs expanded and your temperature down. ° °DIET °You may resume your previous home diet once your are discharged from the hospital. ° °DRESSING / WOUND CARE / SHOWERING °You have  an adhesive waterproof bandage over the incision. Leave this in place until your first follow-up appointment. Once you remove this you will not need to place another bandage.  °You may begin showering 3 days following surgery, but do not submerge the incision under water. ° °ACTIVITY °For the first 3-5 days, it is important to rest and keep the operative leg elevated. You should, as a general rule, rest for 50 minutes and walk/stretch for 10 minutes per hour. After 5 days, you may slowly increase activity as tolerated.  °Perform the exercises you were provided twice a day for about 15-20 minutes each session. Begin these 2 days following surgery. °Walk with your walker as instructed. Use the walker until you are comfortable transitioning to a cane. Walk with the cane in the opposite hand of the operative leg. You may discontinue the cane once you are comfortable and walking steadily. °Avoid periods of inactivity such as sitting longer than an hour when not asleep. This helps prevent blood clots.  °Do not drive a car for 6 weeks or until released by your surgeon.  °Do not drive while taking narcotics. ° °TED HOSE STOCKINGS °Wear the elastic stockings on both legs for three weeks following surgery during the day. You may remove them at night while sleeping. ° °WEIGHT BEARING °Weight bearing as tolerated with assist device (walker, cane, etc) as directed, use it as long as suggested by your surgeon or therapist, typically at least 4-6 weeks. ° °POSTOPERATIVE CONSTIPATION PROTOCOL °Constipation - defined medically as fewer than three stools per week and severe constipation as   less than one stool per week. ° °One of the most common issues patients have following surgery is constipation.  Even if you have a regular bowel pattern at home, your normal regimen is likely to be disrupted due to multiple reasons following surgery.  Combination of anesthesia, postoperative narcotics, change in appetite and fluid intake all can  affect your bowels.  In order to avoid complications following surgery, here are some recommendations in order to help you during your recovery period. ° °Colace (docusate) - Pick up an over-the-counter form of Colace or another stool softener and take twice a day as long as you are requiring postoperative pain medications.  Take with a full glass of water daily.  If you experience loose stools or diarrhea, hold the colace until you stool forms back up.  If your symptoms do not get better within 1 week or if they get worse, check with your doctor. °Dulcolax (bisacodyl) - Pick up over-the-counter and take as directed by the product packaging as needed to assist with the movement of your bowels.  Take with a full glass of water.  Use this product as needed if not relieved by Colace only.  °MiraLax (polyethylene glycol) - Pick up over-the-counter to have on hand.  MiraLax is a solution that will increase the amount of water in your bowels to assist with bowel movements.  Take as directed and can mix with a glass of water, juice, soda, coffee, or tea.  Take if you go more than two days without a movement.Do not use MiraLax more than once per day. Call your doctor if you are still constipated or irregular after using this medication for 7 days in a row. ° °If you continue to have problems with postoperative constipation, please contact the office for further assistance and recommendations.  If you experience "the worst abdominal pain ever" or develop nausea or vomiting, please contact the office immediatly for further recommendations for treatment. ° °ITCHING ° If you experience itching with your medications, try taking only a single pain pill, or even half a pain pill at a time.  You can also use Benadryl over the counter for itching or also to help with sleep.  ° °MEDICATIONS °See your medication summary on the “After Visit Summary” that the nursing staff will review with you prior to discharge.  You may have some home  medications which will be placed on hold until you complete the course of blood thinner medication.  It is important for you to complete the blood thinner medication as prescribed by your surgeon.  Continue your approved medications as instructed at time of discharge. ° °PRECAUTIONS °If you experience chest pain or shortness of breath - call 911 immediately for transfer to the hospital emergency department.  °If you develop a fever greater that 101 F, purulent drainage from wound, increased redness or drainage from wound, foul odor from the wound/dressing, or calf pain - CONTACT YOUR SURGEON.   °                                                °FOLLOW-UP APPOINTMENTS °Make sure you keep all of your appointments after your operation with your surgeon and caregivers. You should call the office at the above phone number and make an appointment for approximately two weeks after the date of your surgery or on the   date instructed by your surgeon outlined in the "After Visit Summary". ° °RANGE OF MOTION AND STRENGTHENING EXERCISES  °These exercises are designed to help you keep full movement of your hip joint. Follow your caregiver's or physical therapist's instructions. Perform all exercises about fifteen times, three times per day or as directed. Exercise both hips, even if you have had only one joint replacement. These exercises can be done on a training (exercise) mat, on the floor, on a table or on a bed. Use whatever works the best and is most comfortable for you. Use music or television while you are exercising so that the exercises are a pleasant break in your day. This will make your life better with the exercises acting as a break in routine you can look forward to.  °Lying on your back, slowly slide your foot toward your buttocks, raising your knee up off the floor. Then slowly slide your foot back down until your leg is straight again.  °Lying on your back spread your legs as far apart as you can without causing  discomfort.  °Lying on your side, raise your upper leg and foot straight up from the floor as far as is comfortable. Slowly lower the leg and repeat.  °Lying on your back, tighten up the muscle in the front of your thigh (quadriceps muscles). You can do this by keeping your leg straight and trying to raise your heel off the floor. This helps strengthen the largest muscle supporting your knee.  °Lying on your back, tighten up the muscles of your buttocks both with the legs straight and with the knee bent at a comfortable angle while keeping your heel on the floor.  ° °POST-OPERATIVE OPIOID TAPER INSTRUCTIONS: °It is important to wean off of your opioid medication as soon as possible. If you do not need pain medication after your surgery it is ok to stop day one. °Opioids include: °Codeine, Hydrocodone(Norco, Vicodin), Oxycodone(Percocet, oxycontin) and hydromorphone amongst others.  °Long term and even short term use of opiods can cause: °Increased pain response °Dependence °Constipation °Depression °Respiratory depression °And more.  °Withdrawal symptoms can include °Flu like symptoms °Nausea, vomiting °And more °Techniques to manage these symptoms °Hydrate well °Eat regular healthy meals °Stay active °Use relaxation techniques(deep breathing, meditating, yoga) °Do Not substitute Alcohol to help with tapering °If you have been on opioids for less than two weeks and do not have pain than it is ok to stop all together.  °Plan to wean off of opioids °This plan should start within one week post op of your joint replacement. °Maintain the same interval or time between taking each dose and first decrease the dose.  °Cut the total daily intake of opioids by one tablet each day °Next start to increase the time between doses. °The last dose that should be eliminated is the evening dose.  ° °IF YOU ARE TRANSFERRED TO A SKILLED REHAB FACILITY °If the patient is transferred to a skilled rehab facility following release from the  hospital, a list of the current medications will be sent to the facility for the patient to continue.  When discharged from the skilled rehab facility, please have the facility set up the patient's Home Health Physical Therapy prior to being released. Also, the skilled facility will be responsible for providing the patient with their medications at time of release from the facility to include their pain medication, the muscle relaxants, and their blood thinner medication. If the patient is still at the rehab facility   at time of the two week follow up appointment, the skilled rehab facility will also need to assist the patient in arranging follow up appointment in our office and any transportation needs. ° °MAKE SURE YOU:  °Understand these instructions.  °Get help right away if you are not doing well or get worse.  ° ° °DENTAL ANTIBIOTICS: ° °In most cases prophylactic antibiotics for Dental procdeures after total joint surgery are not necessary. ° °Exceptions are as follows: ° °1. History of prior total joint infection ° °2. Severely immunocompromised (Organ Transplant, cancer chemotherapy, Rheumatoid biologic °meds such as Humera) ° °3. Poorly controlled diabetes (A1C &gt; 8.0, blood glucose over 200) ° °If you have one of these conditions, contact your surgeon for an antibiotic prescription, prior to your °dental procedure.  ° ° °Pick up stool softner and laxative for home use following surgery while on pain medications. °Do not submerge incision under water. °Please use good hand washing techniques while changing dressing each day. °May shower starting three days after surgery. °Please use a clean towel to pat the incision dry following showers. °Continue to use ice for pain and swelling after surgery. °Do not use any lotions or creams on the incision until instructed by your surgeon. ° °

## 2021-11-25 DIAGNOSIS — Z7982 Long term (current) use of aspirin: Secondary | ICD-10-CM | POA: Diagnosis not present

## 2021-11-25 DIAGNOSIS — Z87891 Personal history of nicotine dependence: Secondary | ICD-10-CM | POA: Diagnosis not present

## 2021-11-25 DIAGNOSIS — I10 Essential (primary) hypertension: Secondary | ICD-10-CM | POA: Diagnosis not present

## 2021-11-25 DIAGNOSIS — Z79899 Other long term (current) drug therapy: Secondary | ICD-10-CM | POA: Diagnosis not present

## 2021-11-25 DIAGNOSIS — M1612 Unilateral primary osteoarthritis, left hip: Secondary | ICD-10-CM | POA: Diagnosis not present

## 2021-11-25 LAB — CBC
HCT: 31.3 % — ABNORMAL LOW (ref 36.0–46.0)
Hemoglobin: 10.4 g/dL — ABNORMAL LOW (ref 12.0–15.0)
MCH: 29.9 pg (ref 26.0–34.0)
MCHC: 33.2 g/dL (ref 30.0–36.0)
MCV: 89.9 fL (ref 80.0–100.0)
Platelets: 190 10*3/uL (ref 150–400)
RBC: 3.48 MIL/uL — ABNORMAL LOW (ref 3.87–5.11)
RDW: 13.2 % (ref 11.5–15.5)
WBC: 5.3 10*3/uL (ref 4.0–10.5)
nRBC: 0 % (ref 0.0–0.2)

## 2021-11-25 LAB — BASIC METABOLIC PANEL
Anion gap: 7 (ref 5–15)
BUN: 14 mg/dL (ref 8–23)
CO2: 24 mmol/L (ref 22–32)
Calcium: 8.7 mg/dL — ABNORMAL LOW (ref 8.9–10.3)
Chloride: 106 mmol/L (ref 98–111)
Creatinine, Ser: 0.56 mg/dL (ref 0.44–1.00)
GFR, Estimated: >60 mL/min (ref >60)
Glucose, Bld: 115 mg/dL — ABNORMAL HIGH (ref 70–99)
Potassium: 3.8 mmol/L (ref 3.5–5.1)
Sodium: 137 mmol/L (ref 135–145)

## 2021-11-25 MED ORDER — METHOCARBAMOL 500 MG PO TABS: 500.0000 mg | ORAL_TABLET | Freq: Four times a day (QID) | ORAL | 0 refills | Status: DC | PRN

## 2021-11-25 MED ORDER — OXYCODONE HCL 5 MG PO TABS
5.0000 mg | ORAL_TABLET | Freq: Four times a day (QID) | ORAL | 0 refills | Status: DC | PRN
Start: 2021-11-25 — End: 2023-04-04

## 2021-11-25 MED ORDER — ASPIRIN 325 MG PO TBEC: 325.0000 mg | DELAYED_RELEASE_TABLET | Freq: Two times a day (BID) | ORAL | 0 refills | Status: AC

## 2021-11-25 MED ORDER — TRAMADOL HCL 50 MG PO TABS: 50.0000 mg | ORAL_TABLET | Freq: Four times a day (QID) | ORAL | 0 refills | Status: AC | PRN

## 2021-11-25 NOTE — TOC Transition Note (Signed)
Transition of Care [facility]) - CM/SW Discharge Note  Patient Details  Name: Robin Obrien MRN: [id number] Date of Birth: 10/12/1951  [facility] CM/SW Contact:  Sherie Don, LCSW Phone Number: 11/25/2021, 10:40 AM  Clinical Narrative: Patient is expected to discharge home after working with PT. CSW met with patient and her daughter, Robin Obrien, to confirm discharge plan. Patient will discharge home with a home exercise program (HEP). Patient has a rolling walker and toilet riser at home, so there are no DME needs at this time. Patient and daughter asked about a CAP aide through the patient's Medicaid. CSW encouraged patient to call her Medicaid case worker to see if PCS could be set up. TOC signing off.   Final next level of care: Home/Self Care Barriers to Discharge: No Barriers Identified  Patient Goals and CMS Choice Patient states their goals for this hospitalization and ongoing recovery are:: Discharge home with HEP Choice offered to / list presented to : NA  Discharge Plan and Services         DME Arranged: N/A DME Agency: NA  Readmission Risk Interventions No flowsheet data found.

## 2021-11-25 NOTE — Progress Notes (Signed)
Orthopedic Tech Progress Note Patient Details:  Robin Obrien 1951/01/13 [id number]  Patient ID: Robin Obrien, female   DOB: [birth year], 71 y.o.   MRN: [id number] No OHF; pt over 40.  Robin Obrien 11/25/2021, 9:17 AM

## 2021-11-25 NOTE — Progress Notes (Signed)
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 Hx of bil hip OA    PT Comments    POD # 1 am session General Comments: AxO x 3 but groggy/slowly requiring repeat instructions and increased time to process (pain meds?) Getting OOB and attempting to amb was very difficult. General transfer comment: despite several attempts and increased time, pt was unable to forward lean and rise even from elevated bed.  Impaired coordination and difficulty focus to task.  Required + 2 MAX Asisst with severe posterior lean.General Gait Details: pt was unable to take uny functional steps.  Slow/groggy/drunk.  Assisted on and off BSC only. Pt was given 10 mg Oxy around 8am Pt will need another PT session this afternoon.   Recommendations for follow up therapy are one component of a multi-disciplinary discharge planning process, led by the attending physician.  Recommendations may be updated based on patient status, additional functional criteria and insurance authorization.  Follow Up Recommendations  Follow physician's recommendations for discharge plan and follow up therapies     Assistance Recommended at Discharge Frequent or constant Supervision/Assistance  Patient can return home with the following A lot of help with walking and/or transfers;Two people to help with walking and/or transfers;Assist for transportation;Assistance with cooking/housework;A little help with bathing/dressing/bathroom   Equipment Recommendations  None recommended by PT    Recommendations for Other Services       Precautions / Restrictions Precautions Precautions: Fall Restrictions Weight Bearing Restrictions: No Other Position/Activity Restrictions: WBAT     Mobility  Bed Mobility Overal bed mobility: Needs Assistance Bed Mobility: Supine to Sit     Supine to sit: Mod assist;HOB  elevated;Max assist     General bed mobility comments: increased, increased time with Mod/Max Assist for upper body and support LE.    Transfers Overall transfer level: Needs assistance Equipment used: Rolling walker (2 wheels) Transfers: Sit to/from Stand Sit to Stand: Max assist;+2 physical assistance;Total assist;+2 safety/equipment           General transfer comment: despite several attempts and increased time, pt was unable to forward lean and rise even from elevated bed.  Impaired coordination and difficulty focus to task.  Required + 2 MAX Asisst with severe posterior lean.    Ambulation/Gait               General Gait Details: pt was unable to take uny functional steps.  Slow/groggy/drunk.  Assisted on and off BSC only.   Stairs             Wheelchair Mobility    Modified Rankin (Stroke Patients Only)       Balance                                            Cognition Arousal/Alertness: Awake/alert (groggy/sleepy) Behavior During Therapy: WFL for tasks assessed/performed Overall Cognitive Status: Within Functional Limits for tasks assessed                                 General Comments: AxO x 3 but groggy/slowly requiring repeat instructions and increased time to process (pain meds?)        Exercises      General  Comments        Pertinent Vitals/Pain Pain Assessment: Faces Faces Pain Scale: Hurts even more Pain Location: L hip Pain Descriptors / Indicators: Discomfort;Sore;Aching;Operative site guarding Pain Intervention(s): Monitored during session;Premedicated before session;Repositioned;Ice applied    Home Living                          Prior Function            PT Goals (current goals can now be found in the care plan section) Progress towards PT goals: Progressing toward goals    Frequency    7X/week      PT Plan Current plan remains appropriate    Co-evaluation               AM-PAC PT "6 Clicks" Mobility   Outcome Measure  Help needed turning from your back to your side while in a flat bed without using bedrails?: A Lot Help needed moving from lying on your back to sitting on the side of a flat bed without using bedrails?: A Lot Help needed moving to and from a bed to a chair (including a wheelchair)?: A Lot Help needed standing up from a chair using your arms (e.g., wheelchair or bedside chair)?: A Lot Help needed to walk in hospital room?: A Lot Help needed climbing 3-5 steps with a railing? : Total 6 Click Score: 11    End of Session Equipment Utilized During Treatment: Gait belt Activity Tolerance: Patient limited by fatigue Patient left: in chair;with call bell/phone within reach Nurse Communication: Mobility status PT Visit Diagnosis: Other abnormalities of gait and mobility (R26.89);Pain Pain - Right/Left: Left Pain - part of body: Hip     Time: 2119-4174 PT Time Calculation (min) (ACUTE ONLY): 46 min  Charges:  $Therapeutic Activity: 38-52 mins                     {Darion Milewski  PTA Acute  Rehabilitation Services Pager      [phone] Office      [phone]

## 2021-11-25 NOTE — Progress Notes (Signed)
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 Hx of bil hip OA    PT Comments    POD # 1 pm session General Comments: AxO x 3 but groggy/slowly requiring repeat instructions and increased time to process (pain meds?) slow to wear off.  Daughter present and very encouraging. General transfer comment: pt was unable to rise from recliner.  + 2 assist called to room.  Ended up pulling her up using a long bed sheet placed around her waist.  Pt still present with impaired coordination and slow processing.General Gait Details: very difficult to amb due to effort/coordiantion with very short shuffled steps and difficulty weightshifting onto her L.  Recliner following closely behind.  Pt has NOT met mobility goals to safely D/C to home today. Reported to RN and also stated $RemoveBef'10mg'fsUagPODJs$  Oxy "maybe too much for her only give $RemoveB'5mg'tsVwmftH$ ".    Recommendations for follow up therapy are one component of a multi-disciplinary discharge planning process, led by the attending physician.  Recommendations may be updated based on patient status, additional functional criteria and insurance authorization.  Follow Up Recommendations  Follow physician's recommendations for discharge plan and follow up therapies     Assistance Recommended at Discharge Frequent or constant Supervision/Assistance  Patient can return home with the following A lot of help with walking and/or transfers;Two people to help with walking and/or transfers;Assist for transportation;Assistance with cooking/housework;A little help with bathing/dressing/bathroom   Equipment Recommendations  None recommended by PT    Recommendations for Other Services       Precautions / Restrictions Precautions Precautions: Fall Restrictions Weight Bearing Restrictions: No Other Position/Activity Restrictions: WBAT     Mobility  Bed  Mobility General bed mobility comments: OOB in recliner    Transfers Overall transfer level: Needs assistance Equipment used: Rolling walker (2 wheels) Transfers: Sit to/from Stand Sit to Stand: Max assist;+2 physical assistance;Total assist;+2 safety/equipment           General transfer comment: pt was unable to rise from recliner.  + 2 assist called to room.  Ended up pulling her up using a long bed sheet placed around her waist.  Pt still present with impaired coordination and slow processing.    Ambulation/Gait Ambulation/Gait assistance: Mod assist;Max assist;+2 physical assistance;+2 safety/equipment Gait Distance (Feet): 4 Feet Assistive device: Rolling walker (2 wheels) Gait Pattern/deviations: Step-to pattern Gait velocity: decreased     General Gait Details: very difficult to amb due to effort/coordiantion with very short shuffled steps and difficulty weightshifting onto her L.  Recliner following closely behind.   Stairs             Wheelchair Mobility    Modified Rankin (Stroke Patients Only)       Balance                                            Cognition Arousal/Alertness: Awake/alert (groggy/sleepy) Behavior During Therapy: WFL for tasks assessed/performed Overall Cognitive Status: Within Functional Limits for tasks assessed                                 General Comments: AxO x 3 but groggy/slowly requiring repeat instructions and increased time to process (pain  meds?)        Exercises      General Comments        Pertinent Vitals/Pain Pain Assessment: Faces Faces Pain Scale: Hurts even more Pain Location: L hip Pain Descriptors / Indicators: Discomfort;Sore;Aching;Operative site guarding Pain Intervention(s): Monitored during session;Premedicated before session;Repositioned;Ice applied    Home Living                          Prior Function            PT Goals (current goals can  now be found in the care plan section) Progress towards PT goals: Progressing toward goals    Frequency    7X/week      PT Plan Current plan remains appropriate    Co-evaluation              AM-PAC PT "6 Clicks" Mobility   Outcome Measure  Help needed turning from your back to your side while in a flat bed without using bedrails?: A Lot Help needed moving from lying on your back to sitting on the side of a flat bed without using bedrails?: A Lot Help needed moving to and from a bed to a chair (including a wheelchair)?: A Lot Help needed standing up from a chair using your arms (e.g., wheelchair or bedside chair)?: A Lot Help needed to walk in hospital room?: A Lot Help needed climbing 3-5 steps with a railing? : Total 6 Click Score: 11    End of Session Equipment Utilized During Treatment: Gait belt Activity Tolerance: Patient limited by fatigue Patient left: in chair;with call bell/phone within reach Nurse Communication: Mobility status PT Visit Diagnosis: Other abnormalities of gait and mobility (R26.89);Pain Pain - Right/Left: Left Pain - part of body: Hip     Time: 1430-1500 PT Time Calculation (min) (ACUTE ONLY): 30 min  Charges:  $Gait Training: 8-22 mins $Therapeutic Activity: 8-22 mins                     {Brendin Situ  PTA Acute  Rehabilitation Services Pager      [phone] Office      [phone]

## 2021-11-25 NOTE — Progress Notes (Signed)
° °  Subjective: 1 Day Post-Op Procedure(s) (LRB): TOTAL HIP ARTHROPLASTY ANTERIOR APPROACH (Left) Patient reports pain as mild.   Patient seen in rounds by Dr. Wynelle Link. Patient is well, and has had no acute complaints or problems. Denies SOB, chest pain, or calf pain. No acute overnight events. Ambulated 7 feet with therapy yesterday.   We will continue therapy today.   Objective: Vital signs in last 24 hours: Temp:  [97.6 F (36.4 C)-98.9 F (37.2 C)] 98.9 F (37.2 C) (01/05 0611) Pulse Rate:  [59-86] 86 (01/05 0611) Resp:  [11-23] 17 (01/05 0611) BP: (87-127)/(55-95) 104/74 (01/05 0611) SpO2:  [90 %-100 %] 92 % (01/05 0611) FiO2 (%):  [21 %] 21 % (01/04 2204) Weight:  [72.6 kg] 72.6 kg (01/04 0840)  Intake/Output from previous day:  Intake/Output Summary (Last 24 hours) at 11/25/2021 0812 Last data filed at 11/25/2021 0500 Gross per 24 hour  Intake 2175.22 ml  Output 1450 ml  Net 725.22 ml     Intake/Output this shift: No intake/output data recorded.  Labs: Recent Labs    11/25/21 0328  HGB 10.4*   Recent Labs    11/25/21 0328  WBC 5.3  RBC 3.48*  HCT 31.3*  PLT 190   Recent Labs    11/25/21 0328  NA 137  K 3.8  CL 106  CO2 24  BUN 14  CREATININE 0.56  GLUCOSE 115*  CALCIUM 8.7*   No results for input(s): LABPT, INR in the last 72 hours.  Exam: General - Patient is Alert and Oriented Extremity - Neurologically intact Neurovascular intact Intact pulses distally Dressing - dressing C/D/I Motor Function - intact, moving foot and toes well on exam.   Past Medical History:  Diagnosis Date   ALLERGIC RHINITIS 03/18/2008   Arthritis    hips   BACK PAIN 01/01/2009   CHOLELITHIASIS 03/18/2008   COLONIC POLYPS, HX OF 03/18/2008   FLANK PAIN, RIGHT 03/01/2010   GERD 03/18/2008   HEMATURIA UNSPECIFIED 03/01/2010   History of kidney stones    HYPERTENSION 03/18/2008   HYPOKALEMIA 03/18/2008   Irritable bowel syndrome 01/01/2009   LEG CRAMPS  03/01/2010   LIVER FUNCTION TESTS, ABNORMAL 03/18/2008   NEPHROLITHIASIS, HX OF 03/01/2010   Preventative health care 04/03/2011   Rectal fissure    Tubular adenoma of colon     Assessment/Plan: 1 Day Post-Op Procedure(s) (LRB): TOTAL HIP ARTHROPLASTY ANTERIOR APPROACH (Left) Principal Problem:   OA (osteoarthritis) of hip Active Problems:   S/P total left hip arthroplasty  Estimated body mass index is 27.46 kg/m as calculated from the following:   Height as of this encounter: 5\' 4"  (1.626 m).   Weight as of this encounter: 72.6 kg. Up with therapy  DVT Prophylaxis - Aspirin and TED hose Weight bearing as tolerated. Continue therapy.  Plan is to go Home after hospital stay.   Plan for two sessions with PT this morning, and if meeting goals, will plan for discharge this afternoon.   Patient to follow up in two weeks with Dr. Wynelle Link in clinic.   The PDMP database was reviewed today prior to any opioid medications being prescribed to this patient.Fenton Foy, [place], PA-C Orthopedic Surgery [phone] 11/25/2021, 8:12 AM

## 2021-11-26 DIAGNOSIS — Z7982 Long term (current) use of aspirin: Secondary | ICD-10-CM | POA: Diagnosis not present

## 2021-11-26 DIAGNOSIS — Z79899 Other long term (current) drug therapy: Secondary | ICD-10-CM | POA: Diagnosis not present

## 2021-11-26 DIAGNOSIS — I10 Essential (primary) hypertension: Secondary | ICD-10-CM | POA: Diagnosis not present

## 2021-11-26 DIAGNOSIS — M1612 Unilateral primary osteoarthritis, left hip: Secondary | ICD-10-CM | POA: Diagnosis not present

## 2021-11-26 DIAGNOSIS — Z87891 Personal history of nicotine dependence: Secondary | ICD-10-CM | POA: Diagnosis not present

## 2021-11-26 LAB — BASIC METABOLIC PANEL
Anion gap: 7 (ref 5–15)
BUN: 15 mg/dL (ref 8–23)
CO2: 23 mmol/L (ref 22–32)
Calcium: 8.3 mg/dL — ABNORMAL LOW (ref 8.9–10.3)
Chloride: 105 mmol/L (ref 98–111)
Creatinine, Ser: 0.47 mg/dL (ref 0.44–1.00)
GFR, Estimated: >60 mL/min (ref >60)
Glucose, Bld: 120 mg/dL — ABNORMAL HIGH (ref 70–99)
Potassium: 3.8 mmol/L (ref 3.5–5.1)
Sodium: 135 mmol/L (ref 135–145)

## 2021-11-26 LAB — CBC
HCT: 28.7 % — ABNORMAL LOW (ref 36.0–46.0)
Hemoglobin: 9.6 g/dL — ABNORMAL LOW (ref 12.0–15.0)
MCH: 29.4 pg (ref 26.0–34.0)
MCHC: 33.4 g/dL (ref 30.0–36.0)
MCV: 87.8 fL (ref 80.0–100.0)
Platelets: 182 10*3/uL (ref 150–400)
RBC: 3.27 MIL/uL — ABNORMAL LOW (ref 3.87–5.11)
RDW: 13.3 % (ref 11.5–15.5)
WBC: 8 10*3/uL (ref 4.0–10.5)
nRBC: 0 % (ref 0.0–0.2)

## 2021-11-26 NOTE — Progress Notes (Signed)
° °  Subjective: 2 Days Post-Op Procedure(s) (LRB): TOTAL HIP ARTHROPLASTY ANTERIOR APPROACH (Left) Patient reports pain as mild.   Feels much better this AM. Able to get up to the bathroom much easier last night and this AM  Objective: Vital signs in last 24 hours: Temp:  [98.3 F (36.8 C)-99.6 F (37.6 C)] 98.3 F (36.8 C) (01/06 0549) Pulse Rate:  [78-99] 78 (01/06 0549) Resp:  [16-20] 17 (01/06 0549) BP: (100-122)/(64-74) 110/68 (01/06 0549) SpO2:  [91 %-98 %] 98 % (01/06 0549)  Intake/Output from previous day:  Intake/Output Summary (Last 24 hours) at 11/26/2021 0642 Last data filed at 11/25/2021 2124 Gross per 24 hour  Intake 1204.93 ml  Output 500 ml  Net 704.93 ml    Intake/Output this shift: Total I/O In: -  Out: 250 [Urine:250]  Labs: Recent Labs    11/25/21 0328 11/26/21 0335  HGB 10.4* 9.6*   Recent Labs    11/25/21 0328 11/26/21 0335  WBC 5.3 8.0  RBC 3.48* 3.27*  HCT 31.3* 28.7*  PLT 190 182   Recent Labs    11/25/21 0328 11/26/21 0335  NA 137 135  K 3.8 3.8  CL 106 105  CO2 24 23  BUN 14 15  CREATININE 0.56 0.47  GLUCOSE 115* 120*  CALCIUM 8.7* 8.3*   No results for input(s): LABPT, INR in the last 72 hours.  EXAM General - Patient is Alert, Appropriate, and Oriented Extremity - Neurologically intact Neurovascular intact Dorsiflexion/Plantar flexion intact No cellulitis present Compartment soft Dressing/Incision - clean, dry, no drainage Motor Function - intact, moving foot and toes well on exam.   Past Medical History:  Diagnosis Date   ALLERGIC RHINITIS 03/18/2008   Arthritis    hips   BACK PAIN 01/01/2009   CHOLELITHIASIS 03/18/2008   COLONIC POLYPS, HX OF 03/18/2008   FLANK PAIN, RIGHT 03/01/2010   GERD 03/18/2008   HEMATURIA UNSPECIFIED 03/01/2010   History of kidney stones    HYPERTENSION 03/18/2008   HYPOKALEMIA 03/18/2008   Irritable bowel syndrome 01/01/2009   LEG CRAMPS 03/01/2010   LIVER FUNCTION TESTS,  ABNORMAL 03/18/2008   NEPHROLITHIASIS, HX OF 03/01/2010   Preventative health care 04/03/2011   Rectal fissure    Tubular adenoma of colon     Assessment/Plan: 2 Days Post-Op Procedure(s) (LRB): TOTAL HIP ARTHROPLASTY ANTERIOR APPROACH (Left) Principal Problem:   OA (osteoarthritis) of hip Active Problems:   S/P total left hip arthroplasty   Up with therapy Discharge home after PT   Gaynelle Arabian 11/26/2021, 6:42 AM

## 2021-11-26 NOTE — Progress Notes (Signed)
Physical Therapy Treatment Patient Details Name: Robin Obrien MRN: [id number] DOB: 1951-07-19 Today's Date: 11/26/2021   History of Present Illness 71 yo female s/p L THA-DA 11/24/21. Hx of bil hip OA    PT Comments    Pod # 2 AM session Cognition much improved only getting 5 mg Oxy vs 10 mg. Pt already OOB and had amb to bathroom with nursing staff.  Daughter present during session.  Practiced stairs.  General Gait Details: pt tolerated amb a functional distance 55 feet at Supervision/MinGuard Assist with 25% VC's to increase heel strike and avoid "toe walking".  Pt reports her "other" hip needs a replacement and her knees are "weak".  Excessive lean on walker. Then amb in hallway.  General Gait Details: pt tolerated amb a functional distance 55 feet at Supervision/MinGuard Assist with 25% VC's to increase heel strike and avoid "toe walking".  Pt reports her "other" hip needs a replacement and her knees are "weak".  Excessive lean on walker.  Pt will need another PT session to attempts stairs again and educate HEP.  Planning a 2:30 pm discharge to home.    Recommendations for follow up therapy are one component of a multi-disciplinary discharge planning process, led by the attending physician.  Recommendations may be updated based on patient status, additional functional criteria and insurance authorization.  Follow Up Recommendations  Follow physician's recommendations for discharge plan and follow up therapies     Assistance Recommended at Discharge Frequent or constant Supervision/Assistance  Patient can return home with the following A lot of help with walking and/or transfers;Two people to help with walking and/or transfers;Assist for transportation;Assistance with cooking/housework;A little help with bathing/dressing/bathroom   Equipment Recommendations  None recommended by PT    Recommendations for Other Services       Precautions / Restrictions Precautions Precautions:  Fall Restrictions Weight Bearing Restrictions: No Other Position/Activity Restrictions: WBAT     Mobility  Bed Mobility               General bed mobility comments: OOB in recliner    Transfers Overall transfer level: Needs assistance Equipment used: Rolling walker (2 wheels) Transfers: Sit to/from Stand Sit to Stand: Supervision;Min guard           General transfer comment: pt was self able to rise from recliner with only one VC on proper hand placement and to forward shift upper body.    Ambulation/Gait Ambulation/Gait assistance: Supervision;Min guard Gait Distance (Feet): 55 Feet Assistive device: Rolling walker (2 wheels) Gait Pattern/deviations: Step-to pattern;Decreased stance time - left Gait velocity: decreased     General Gait Details: pt tolerated amb a functional distance 55 feet at Supervision/MinGuard Assist with 25% VC's to increase heel strike and avoid "toe walking".  Pt reports her "other" hip needs a replacement and her knees are "weak".  Excessive lean on walker.   Stairs Stairs: Yes Stairs assistance: Min assist Stair Management: No rails;Step to pattern;Backwards Number of Stairs: 2 General stair comments: with Daughter present for "hand on" instruction attempted to go up 2 steps backward with walker at 75% VC's on proper tech.  Pt VERY nervous requiring increased time and prompts.  Pt was only able to perform going up one step so advised to have pt sit on a chair with arms then be pulled back into house.  Pt having another PT session today before D/C, so will practice steps again to attempt full 2 steps.   Wheelchair Mobility    Modified Rankin (  Stroke Patients Only)       Balance                                            Cognition Arousal/Alertness: Awake/alert Behavior During Therapy: WFL for tasks assessed/performed Overall Cognitive Status: Within Functional Limits for tasks assessed                                  General Comments: Much improved alertness this morning and ability to retain and recall new instructions.        Exercises      General Comments        Pertinent Vitals/Pain Pain Assessment: 0-10 Pain Score: 4  Pain Location: L hip Pain Descriptors / Indicators: Tender;Tightness;Operative site guarding Pain Intervention(s): Monitored during session;Premedicated before session;Repositioned;Patient requesting pain meds-RN notified    Home Living                          Prior Function            PT Goals (current goals can now be found in the care plan section) Progress towards PT goals: Progressing toward goals    Frequency    7X/week      PT Plan Current plan remains appropriate    Co-evaluation              AM-PAC PT "6 Clicks" Mobility   Outcome Measure  Help needed turning from your back to your side while in a flat bed without using bedrails?: A Little Help needed moving from lying on your back to sitting on the side of a flat bed without using bedrails?: A Little Help needed moving to and from a bed to a chair (including a wheelchair)?: A Little Help needed standing up from a chair using your arms (e.g., wheelchair or bedside chair)?: A Little Help needed to walk in hospital room?: A Little Help needed climbing 3-5 steps with a railing? : A Lot 6 Click Score: 17    End of Session Equipment Utilized During Treatment: Gait belt Activity Tolerance: Patient limited by fatigue;Other (comment) (slightly anxious/nervous) Patient left: in chair;with call bell/phone within reach Nurse Communication: Mobility status PT Visit Diagnosis: Other abnormalities of gait and mobility (R26.89);Pain Pain - Right/Left: Left Pain - part of body: Hip     Time: 7703-4035 PT Time Calculation (min) (ACUTE ONLY): 30 min  Charges:  $Gait Training: 8-22 mins $Therapeutic Activity: 8-22 mins                     {Korah Hufstedler   PTA Acute  Rehabilitation Services Pager      [phone] Office      [phone]

## 2021-11-26 NOTE — Progress Notes (Signed)
Physical Therapy Treatment Patient Details Name: Robin Obrien MRN: [id number] DOB: 27-Dec-1950 Today's Date: 11/26/2021   History of Present Illness 71 yo female s/p L THA-DA 11/24/21. Hx of bil hip OA    PT Comments    POD # 2 pm session Pt up and dressed with daughter and Niece in room.  Assisted with amb in hallway and practiced stairs again. Assisted up backward with walker and + 2 assist.  Pt performed well. Issued HEP handout and instructed on continue use of ICE. Pt has met mobility goals to  D/C to home with family support.    Recommendations for follow up therapy are one component of a multi-disciplinary discharge planning process, led by the attending physician.  Recommendations may be updated based on patient status, additional functional criteria and insurance authorization.  Follow Up Recommendations  Follow physician's recommendations for discharge plan and follow up therapies     Assistance Recommended at Discharge Frequent or constant Supervision/Assistance  Patient can return home with the following Assist for transportation;Assistance with cooking/housework;A little help with bathing/dressing/bathroom   Equipment Recommendations  None recommended by PT    Recommendations for Other Services       Precautions / Restrictions       Mobility  Bed Mobility               General bed mobility comments: OOB walking in room with daughter    Transfers Overall transfer level: Needs assistance Equipment used: Rolling walker (2 wheels) Transfers: Sit to/from Stand             General transfer comment: pt was self able to rise from recliner with only one VC on proper hand placement and to forward shift upper body.    Ambulation/Gait Ambulation/Gait assistance: Supervision Gait Distance (Feet): 22 Feet Assistive device: Rolling walker (2 wheels) Gait Pattern/deviations: Step-to pattern;Decreased stance time - left Gait velocity: decreased     General  Gait Details: pt able to amb with walker at Supervision level.   Stairs   Stairs assistance: Min assist Stair Management: No rails;Step to pattern;Backwards   General stair comments: with daughter and another family member (both are Nurses) practiced up steps backward again.  pt tolerated well.   Wheelchair Mobility    Modified Rankin (Stroke Patients Only)       Balance                                            Cognition                                                Exercises      General Comments        Pertinent Vitals/Pain      Home Living                          Prior Function            PT Goals (current goals can now be found in the care plan section) Progress towards PT goals: Progressing toward goals    Frequency    7X/week      PT Plan Current plan remains appropriate    Co-evaluation  AM-PAC PT "6 Clicks" Mobility   Outcome Measure  Help needed turning from your back to your side while in a flat bed without using bedrails?: None Help needed moving from lying on your back to sitting on the side of a flat bed without using bedrails?: None Help needed moving to and from a bed to a chair (including a wheelchair)?: None Help needed standing up from a chair using your arms (e.g., wheelchair or bedside chair)?: A Little Help needed to walk in hospital room?: A Little Help needed climbing 3-5 steps with a railing? : A Little 6 Click Score: 21    End of Session Equipment Utilized During Treatment: Gait belt Activity Tolerance: Patient tolerated treatment well Patient left: in chair;with call bell/phone within reach Nurse Communication: Mobility status PT Visit Diagnosis: Other abnormalities of gait and mobility (R26.89);Pain Pain - Right/Left: Left Pain - part of body: Hip     Time: 8776-5486 PT Time Calculation (min) (ACUTE ONLY): 28 min  Charges:  $Gait Training:  8-22 mins $Therapeutic Activity: 8-22 mins                    Rica Koyanagi  PTA Acute  Rehabilitation Services Pager      [phone] Office      [phone]

## 2021-11-26 NOTE — Plan of Care (Signed)
  Problem: Education: Goal: Knowledge of General Education information will improve Description Including pain rating scale, medication(s)/side effects and non-pharmacologic comfort measures Outcome: Progressing   

## 2021-11-26 NOTE — Progress Notes (Signed)
The patient is alert and oriented and has been seen by her physician. The orders for discharge were written. IV has been removed. Went over discharge instructions with patient and family. She is being discharged via wheelchair with all of her belongings.  

## 2021-11-29 ENCOUNTER — Encounter (HOSPITAL_COMMUNITY): Payer: Self-pay | Admitting: Orthopedic Surgery

## 2021-11-29 ENCOUNTER — Other Ambulatory Visit: Payer: Self-pay | Admitting: Internal Medicine

## 2021-11-29 DIAGNOSIS — Z1239 Encounter for other screening for malignant neoplasm of breast: Secondary | ICD-10-CM

## 2021-12-05 NOTE — Discharge Summary (Signed)
Patient ID: Robin Obrien MRN: [id number] DOB/AGE: August 08, 1951 71 y.o.  Admit date: 11/24/2021 Discharge date: 11/26/2021  Admission Diagnoses:  Principal Problem:   OA (osteoarthritis) of hip Active Problems:   S/P total left hip arthroplasty   Discharge Diagnoses:  Same  Past Medical History:  Diagnosis Date   ALLERGIC RHINITIS 03/18/2008   Arthritis    hips   BACK PAIN 01/01/2009   CHOLELITHIASIS 03/18/2008   COLONIC POLYPS, HX OF 03/18/2008   FLANK PAIN, RIGHT 03/01/2010   GERD 03/18/2008   HEMATURIA UNSPECIFIED 03/01/2010   History of kidney stones    HYPERTENSION 03/18/2008   HYPOKALEMIA 03/18/2008   Irritable bowel syndrome 01/01/2009   LEG CRAMPS 03/01/2010   LIVER FUNCTION TESTS, ABNORMAL 03/18/2008   NEPHROLITHIASIS, HX OF 03/01/2010   Preventative health care 04/03/2011   Rectal fissure    Tubular adenoma of colon     Surgeries: Procedure(s): TOTAL HIP ARTHROPLASTY ANTERIOR APPROACH on 11/24/2021   Consultants:   Discharged Condition: Improved  Hospital Course: Robin Obrien is an 71 y.o. female who was admitted 11/24/2021 for operative treatment ofOA (osteoarthritis) of hip. Patient has severe unremitting pain that affects sleep, daily activities, and work/hobbies. After pre-op clearance the patient was taken to the operating room on 11/24/2021 and underwent  Procedure(s): TOTAL HIP ARTHROPLASTY ANTERIOR APPROACH.    Patient was given perioperative antibiotics:  Anti-infectives (From admission, onward)    Start     Dose/Rate Route Frequency Ordered Stop   11/24/21 1801  ceFAZolin (ANCEF) 2-4 GM/100ML-% IVPB       Note to Pharmacy: Lanell Persons J: cabinet override      11/24/21 1801 11/25/21 0614   11/24/21 1530  ceFAZolin (ANCEF) IVPB 2g/100 mL premix        2 g 200 mL/hr over 30 Minutes Intravenous Every 6 hours 11/24/21 1056 11/25/21 0059   11/24/21 0830  ceFAZolin (ANCEF) IVPB 2g/100 mL premix        2 g 200 mL/hr over 30 Minutes Intravenous On  call to O.R. 11/24/21 4010 11/24/21 0925        Patient was given sequential compression devices, early ambulation, and chemoprophylaxis to prevent DVT.  Patient benefited maximally from hospital stay and there were no complications.    Recent vital signs: No data found.   Recent laboratory studies: No results for input(s): WBC, HGB, HCT, PLT, NA, K, CL, CO2, BUN, CREATININE, GLUCOSE, INR, CALCIUM in the last 72 hours.  Invalid input(s): PT, 2   Discharge Medications:   Allergies as of 11/26/2021       Reactions   Lactose Intolerance (gi)    Mobic [meloxicam] Other (See Comments)   Pt has had issues with bleeding and never wants to take it again        Medication List     STOP taking these medications    ibuprofen 200 MG tablet Commonly known as: ADVIL       TAKE these medications    acetaminophen 500 MG tablet Commonly known as: TYLENOL Take 1,000 mg by mouth daily as needed for moderate pain.   ALIGN PO Take 1 capsule by mouth daily after breakfast.   aspirin EC 325 MG tablet Take 1 tablet (325 mg total) by mouth 2 (two) times daily for 21 days. Then take one 81 mg aspirin once a day for three weeks. Then discontinue aspirin.   aspirin 325 MG EC tablet Take 1 tablet (325 mg total) by mouth 2 (two) times daily. Take for  three weeks, then switch to one 81 mg aspirin once a day for three weeks. Then discontinue aspirin.   benazepril 40 MG tablet Commonly known as: LOTENSIN Take 40 mg by mouth every evening.   bismuth subsalicylate 735 HG/99ME suspension Commonly known as: PEPTO BISMOL Take 30 mLs by mouth every 6 (six) hours as needed for indigestion (upset stomach).   Gaviscon Extra Strength 160-105 MG Chew Generic drug: Alum Hydroxide-Mag Carbonate Chew 1-2 tablets by mouth daily as needed (indigestion/heartburn.).   loperamide 1 MG/5ML solution Commonly known as: IMODIUM Take 2-4 mg by mouth as needed for diarrhea or loose stools.   methocarbamol  500 MG tablet Commonly known as: Robaxin Take 1 tablet (500 mg total) by mouth every 6 (six) hours as needed for muscle spasms.   methocarbamol 500 MG tablet Commonly known as: ROBAXIN Take 1 tablet (500 mg total) by mouth every 6 (six) hours as needed for muscle spasms.   oxyCODONE 5 MG immediate release tablet Commonly known as: Roxicodone Take 1-2 tablets (5-10 mg total) by mouth every 6 (six) hours as needed for severe pain. Not to exceed 6 tablets a day.   oxyCODONE 5 MG immediate release tablet Commonly known as: Oxy IR/ROXICODONE Take 1-2 tablets (5-10 mg total) by mouth every 6 (six) hours as needed for severe pain.   simethicone 125 MG chewable tablet Commonly known as: MYLICON Chew 268-341 mg by mouth every 6 (six) hours as needed for flatulence.   traMADol 50 MG tablet Commonly known as: Ultram Take 1-2 tablets (50-100 mg total) by mouth every 6 (six) hours as needed for moderate pain.   traMADol 50 MG tablet Commonly known as: ULTRAM Take 1-2 tablets (50-100 mg total) by mouth every 6 (six) hours as needed for moderate pain.   VITAMIN B-12 PO Take 1 tablet by mouth daily after breakfast.   Vitamin D3 25 MCG (1000 UT) Caps Take 2,000 Units by mouth daily after breakfast.               Discharge Care Instructions  (From admission, onward)           Start     Ordered   11/25/21 0000  Weight bearing as tolerated        11/25/21 0819   11/25/21 0000  Change dressing       Comments: You have an adhesive waterproof bandage over the incision. Leave this in place until your first follow-up appointment. Once you remove this you will not need to place another bandage.   11/25/21 0819            Diagnostic Studies: DG Pelvis Portable  Result Date: 11/24/2021 CLINICAL DATA:  Left hip replacement EXAM: PORTABLE PELVIS 1-2 VIEWS COMPARISON:  09/12/2017 FINDINGS: Left hip replacement in satisfactory position and alignment. No fracture or complication  Advanced degenerative change in the right hip. IMPRESSION: Satisfactory left hip replacement Electronically Signed   By: Franchot Gallo M.D.   On: 11/24/2021 11:22   DG C-Arm 1-60 Min-No Report  Result Date: 11/24/2021 Fluoroscopy was utilized by the requesting physician.  No radiographic interpretation.   DG HIP OPERATIVE UNILAT W OR W/O PELVIS LEFT  Result Date: 11/24/2021 CLINICAL DATA:  Left anterior hip replacement EXAM: OPERATIVE left HIP (WITH PELVIS IF PERFORMED) 2 VIEWS TECHNIQUE: Fluoroscopic spot image(s) were submitted for interpretation post-operatively. COMPARISON:  09/12/2017 FINDINGS: Left hip replacement in satisfactory position alignment. No fracture or complication IMPRESSION: Satisfactory left hip replacement. Electronically Signed   By:  Franchot Gallo M.D.   On: 11/24/2021 10:49    Disposition: Discharge disposition: 01-Home or Self Care       Discharge Instructions     Call MD / Call 911   Complete by: As directed    If you experience chest pain or shortness of breath, CALL 911 and be transported to the hospital emergency room.  If you develope a fever above 101 F, pus (white drainage) or increased drainage or redness at the wound, or calf pain, call your surgeon's office.   Change dressing   Complete by: As directed    You have an adhesive waterproof bandage over the incision. Leave this in place until your first follow-up appointment. Once you remove this you will not need to place another bandage.   Constipation Prevention   Complete by: As directed    Drink plenty of fluids.  Prune juice may be helpful.  You may use a stool softener, such as Colace (over the counter) 100 mg twice a day.  Use MiraLax (over the counter) for constipation as needed.   Diet - low sodium heart healthy   Complete by: As directed    Do not sit on low chairs, stoools or toilet seats, as it may be difficult to get up from low surfaces   Complete by: As directed    Driving restrictions    Complete by: As directed    No driving for two weeks   Post-operative opioid taper instructions:   Complete by: As directed    POST-OPERATIVE OPIOID TAPER INSTRUCTIONS: It is important to wean off of your opioid medication as soon as possible. If you do not need pain medication after your surgery it is ok to stop day one. Opioids include: Codeine, Hydrocodone(Norco, Vicodin), Oxycodone(Percocet, oxycontin) and hydromorphone amongst others.  Long term and even short term use of opiods can cause: Increased pain response Dependence Constipation Depression Respiratory depression And more.  Withdrawal symptoms can include Flu like symptoms Nausea, vomiting And more Techniques to manage these symptoms Hydrate well Eat regular healthy meals Stay active Use relaxation techniques(deep breathing, meditating, yoga) Do Not substitute Alcohol to help with tapering If you have been on opioids for less than two weeks and do not have pain than it is ok to stop all together.  Plan to wean off of opioids This plan should start within one week post op of your joint replacement. Maintain the same interval or time between taking each dose and first decrease the dose.  Cut the total daily intake of opioids by one tablet each day Next start to increase the time between doses. The last dose that should be eliminated is the evening dose.      TED hose   Complete by: As directed    Use stockings (TED hose) for three weeks on both leg(s).  You may remove them at night for sleeping.   Weight bearing as tolerated   Complete by: As directed         Follow-up Information     Gaynelle Arabian, MD. Go on 12/07/2021.   Specialty: Orthopedic Surgery Why: You are scheduled for first post op appointment on Tuesday January 17th at 3:45pm. Contact information: [address] [place] [address] [phone]                  Signed: Theresa Duty 12/05/2021, 8:29 AM

## 2022-01-04 DIAGNOSIS — Z4789 Encounter for other orthopedic aftercare: Secondary | ICD-10-CM | POA: Diagnosis not present

## 2022-02-01 ENCOUNTER — Ambulatory Visit: Payer: Medicare Other

## 2022-05-27 ENCOUNTER — Other Ambulatory Visit: Payer: Self-pay

## 2022-05-27 ENCOUNTER — Emergency Department (HOSPITAL_BASED_OUTPATIENT_CLINIC_OR_DEPARTMENT_OTHER): Payer: Medicare Other

## 2022-05-27 ENCOUNTER — Emergency Department (HOSPITAL_BASED_OUTPATIENT_CLINIC_OR_DEPARTMENT_OTHER)
Admission: EM | Admit: 2022-05-27 | Discharge: 2022-05-27 | Disposition: A | Discharge status: Home / Self Care | Payer: Medicare Other | Attending: Emergency Medicine | Admitting: Emergency Medicine

## 2022-05-27 ENCOUNTER — Encounter (HOSPITAL_BASED_OUTPATIENT_CLINIC_OR_DEPARTMENT_OTHER): Payer: Self-pay | Admitting: Emergency Medicine

## 2022-05-27 DIAGNOSIS — M79661 Pain in right lower leg: Secondary | ICD-10-CM | POA: Insufficient documentation

## 2022-05-27 DIAGNOSIS — Z7982 Long term (current) use of aspirin: Secondary | ICD-10-CM | POA: Diagnosis not present

## 2022-05-27 DIAGNOSIS — R2241 Localized swelling, mass and lump, right lower limb: Secondary | ICD-10-CM | POA: Diagnosis not present

## 2022-05-27 DIAGNOSIS — M7989 Other specified soft tissue disorders: Secondary | ICD-10-CM | POA: Diagnosis not present

## 2022-05-27 NOTE — Discharge Instructions (Signed)
Your ultrasound today was negative for blood clot and abscess.  On exam, the surrounding skin is cool to the touch which lowers concern for possible skin infection.  While I am not entirely sure what is causing the redness and firmness in that area, I think it is reasonable to withhold starting antibiotics for now.  I have outlined the redness for you here.  If it continues to spread, follow-up with your PCP for antibiotic therapy.  If it does not spread beyond the outline but does not resolve, contact your PCP for possible dermatology follow-up.

## 2022-05-27 NOTE — ED Triage Notes (Signed)
Pt presents with soft lump on back of right calf x 1 week. Area is red and tender with palpation. Unsure if she was bit by something.

## 2022-05-28 NOTE — ED Provider Notes (Signed)
[facility] EMERGENCY DEPARTMENT Provider Note   CSN: [id number] Arrival date & time: 05/27/22  1824     History  Chief Complaint  Patient presents with   Leg Swelling    RLE    Robin Obrien is a 71 y.o. female who was sent here by her PCP for evaluation of a soft lump on the back of her right calf that was present for 1 week.  The area is red and tender to palpation.  Patient states that last night, she noted some swelling around the ankle distal to the lump.  She does not spend a lot of time outdoors and is unsure if she was bit by something.  She does note that several weeks ago she stayed at a rundown [place] and while she did not visualize any bedbugs or have any scabs after leaving there, she wonders if she could possibly have sustained a bedbug bite.  Patient is not on blood thinners.  She denies fever, chills, chest pain, shortness of breath, numbness and tingling.  HPI     Home Medications Prior to Admission medications   Medication Sig Start Date End Date Taking? Authorizing Provider  acetaminophen (TYLENOL) 500 MG tablet Take 1,000 mg by mouth daily as needed for moderate pain.    [provider]  Alum Hydroxide-Mag Carbonate (GAVISCON EXTRA STRENGTH) 160-105 MG CHEW Chew 1-2 tablets by mouth daily as needed (indigestion/heartburn.).    [provider]  aspirin EC 325 MG EC tablet Take 1 tablet (325 mg total) by mouth 2 (two) times daily. Take for three weeks, then switch to one 81 mg aspirin once a day for three weeks. Then discontinue aspirin. 11/25/21   Fenton Foy D, PA-C  benazepril (LOTENSIN) 40 MG tablet Take 40 mg by mouth every evening. 11/05/21   [provider]  bismuth subsalicylate (PEPTO BISMOL) 262 MG/15ML suspension Take 30 mLs by mouth every 6 (six) hours as needed for indigestion (upset stomach).    [provider]  Cholecalciferol (VITAMIN D3) 1000 UNITS CAPS Take 2,000 Units by mouth daily after breakfast.     [provider]  Cyanocobalamin (VITAMIN B-12 PO) Take 1 tablet by mouth daily after breakfast.    [provider]  loperamide (IMODIUM) 1 MG/5ML solution Take 2-4 mg by mouth as needed for diarrhea or loose stools.    [provider]  methocarbamol (ROBAXIN) 500 MG tablet Take 1 tablet (500 mg total) by mouth every 6 (six) hours as needed for muscle spasms. 11/24/21   Edmisten, Ok Anis, PA  methocarbamol (ROBAXIN) 500 MG tablet Take 1 tablet (500 mg total) by mouth every 6 (six) hours as needed for muscle spasms. 11/25/21   Fenton Foy D, PA-C  oxyCODONE (OXY IR/ROXICODONE) 5 MG immediate release tablet Take 1-2 tablets (5-10 mg total) by mouth every 6 (six) hours as needed for severe pain. 11/25/21   Fenton Foy D, PA-C  oxyCODONE (ROXICODONE) 5 MG immediate release tablet Take 1-2 tablets (5-10 mg total) by mouth every 6 (six) hours as needed for severe pain. Not to exceed 6 tablets a day. 11/24/21 11/24/22  Edmisten, Ok Anis, PA  Probiotic Product (ALIGN PO) Take 1 capsule by mouth daily after breakfast.    [provider]  simethicone (MYLICON) 412 MG chewable tablet Chew 125-250 mg by mouth every 6 (six) hours as needed for flatulence.    [provider]  traMADol (ULTRAM) 50 MG tablet Take 1-2 tablets (50-100 mg total) by mouth  every 6 (six) hours as needed for moderate pain. 11/24/21 11/24/22  Edmisten, Ok Anis, PA  traMADol (ULTRAM) 50 MG tablet Take 1-2 tablets (50-100 mg total) by mouth every 6 (six) hours as needed for moderate pain. 11/25/21   Fenton Foy D, PA-C      Allergies    Lactose intolerance (gi) and Mobic [meloxicam]    Review of Systems   Review of Systems  Respiratory:  Negative for shortness of breath.   Cardiovascular:  Negative for chest pain.  Skin:  Positive for color change. Negative for wound.  Neurological:  Negative for numbness.    Physical Exam Updated Vital Signs BP (!) 144/80 (BP Location: Right Arm)    Pulse 90   Temp 98.5 F (36.9 C) (Oral)   Resp 17   SpO2 98%  Physical Exam Vitals and nursing note reviewed.  Constitutional:      General: She is not in acute distress.    Appearance: She is not ill-appearing.  HENT:     Head: Atraumatic.  Eyes:     Conjunctiva/sclera: Conjunctivae normal.  Cardiovascular:     Rate and Rhythm: Normal rate and regular rhythm.     Pulses: Normal pulses.          Radial pulses are 2+ on the right side and 2+ on the left side.       Dorsalis pedis pulses are 2+ on the right side and 2+ on the left side.     Heart sounds: No murmur heard. Pulmonary:     Effort: Pulmonary effort is normal. No respiratory distress.     Breath sounds: Normal breath sounds.  Abdominal:     General: Abdomen is flat. There is no distension.     Palpations: Abdomen is soft.     Tenderness: There is no abdominal tenderness.  Musculoskeletal:        General: Normal range of motion.     Cervical back: Normal range of motion.     Right lower leg: No edema.     Left lower leg: No edema.  Skin:    General: Skin is warm and dry.     Capillary Refill: Capillary refill takes less than 2 seconds.     Comments: Approximately 1 x 1 cm area of redness with firm induration.  Tender to palpation.  No erythema, fluctuance or warmth to the touch.  Neurological:     General: No focal deficit present.     Mental Status: She is alert.  Psychiatric:        Mood and Affect: Mood normal.     ED Results / Procedures / Treatments   Labs (all labs ordered are listed, but only abnormal results are displayed) Labs Reviewed - No data to display  EKG None  Radiology [place] RT LOWER EXTREM LTD SOFT TISSUE NON VASCULAR  Result Date: 05/27/2022 CLINICAL DATA:  Palpable abnormality right calf EXAM: ULTRASOUND RIGHT LOWER EXTREMITY LIMITED TECHNIQUE: Ultrasound examination of the lower extremity soft tissues was performed in the area of clinical concern. COMPARISON:  None Available. FINDINGS:  Sonographic evaluation of the palpable area in the right calf was performed. There are no discrete sonographic abnormalities. No masses or fluid collections. IMPRESSION: 1. No sonographic abnormality to correspond to the reported palpable area. Electronically Signed   By: Randa Ngo M.D.   On: 05/27/2022 20:56   US Venous Img Lower Unilateral Right  Result Date: 05/27/2022 CLINICAL DATA:  Right calf pain, palpable abnormality EXAM: RIGHT  LOWER EXTREMITY VENOUS DOPPLER ULTRASOUND TECHNIQUE: Gray-scale sonography with compression, as well as color and duplex ultrasound, were performed to evaluate the deep venous system(s) from the level of the common femoral vein through the popliteal and proximal calf veins. COMPARISON:  None Available. FINDINGS: VENOUS Normal compressibility of the common femoral, superficial femoral, and popliteal veins, as well as the visualized calf veins. Visualized portions of profunda femoral vein and great saphenous vein unremarkable. No filling defects to suggest DVT on grayscale or color Doppler imaging. Doppler waveforms show normal direction of venous flow, normal respiratory plasticity and response to augmentation. Limited views of the contralateral common femoral vein are unremarkable. OTHER None. Limitations: none IMPRESSION: 1. No evidence of deep venous thrombosis within the right lower extremity. Electronically Signed   By: Randa Ngo M.D.   On: 05/27/2022 20:55    Procedures Procedures    Medications Ordered in ED Medications - No data to display  ED Course/ Medical Decision Making/ A&P Clinical Course as of 05/28/22 0034  Fri May 27, 2022  2111 US Venous Img Lower Unilateral Right [EC]    Clinical Course User Index [EC] Tonye Pearson, PA-C                           Medical Decision Making Amount and/or Complexity of Data Reviewed Radiology: ordered. Decision-making details documented in ED Course.   71 year old female presents to the ED for  evaluation of a red painful lump on the back of her right calf.  Differentials include cellulitis, abscess, neoplasm, DVT.  Vitals are without significant abnormality.  On exam, there is a 1 x 1 cm area of redness with firm induration centrally.  There is no fluctuance.  Interestingly, there is also no erythema or warmth to the touch.  I ordered DVT and soft tissue ultrasound which was negative for fluid collection and negative for blood clot.  Given that the area has no surrounding erythema, my concern for cellulitis is fairly low.  I did discuss with patient option of short course of antibiotics versus follow-up with PCP.  Patient states that she prefers not to do antibiotics as they cause her GI upset.  We have marked the surrounding erythema with skin pen here in the emergency department.  Patient is to call her PCP for antibiotics if it spreads beyond the boundaries of the marker, or requests referral to dermatologist if it does not improve within the next week.  Patient expresses understanding and is amenable to plan.  Discharged home in good condition. Final Clinical Impression(s) / ED Diagnoses Final diagnoses:  Right calf pain    Rx / DC Orders ED Discharge Orders     None         Tonye Pearson, PA-C 05/28/22 0040    Davonna Belling, MD 05/28/22 [phone]

## 2022-05-30 ENCOUNTER — Other Ambulatory Visit (HOSPITAL_COMMUNITY): Payer: Medicare Other

## 2022-06-01 DIAGNOSIS — R2241 Localized swelling, mass and lump, right lower limb: Secondary | ICD-10-CM | POA: Diagnosis not present

## 2022-06-08 ENCOUNTER — Ambulatory Visit: Admit: 2022-06-08 | Payer: Medicare Other | Admitting: Orthopedic Surgery

## 2022-06-08 SURGERY — ARTHROPLASTY, HIP, TOTAL, ANTERIOR APPROACH
Anesthesia: Choice | Site: Hip | Laterality: Right

## 2022-06-13 DIAGNOSIS — L02415 Cutaneous abscess of right lower limb: Secondary | ICD-10-CM | POA: Diagnosis not present

## 2022-08-09 IMAGING — RF DG HIP (WITH PELVIS) OPERATIVE*L*
1 series · 2 of 2 positions shown · non-contrast
Comparison: 09/12/2017

CLINICAL DATA: Left anterior hip replacement

EXAM:
OPERATIVE left HIP (WITH PELVIS IF PERFORMED) 2 VIEWS
TECHNIQUE: Fluoroscopic spot image(s) were submitted for interpretation
post-operatively.

[Series 1: unknown protocol · 0.20mm/px · 2 of 2 slices shown]
[im 1/2]
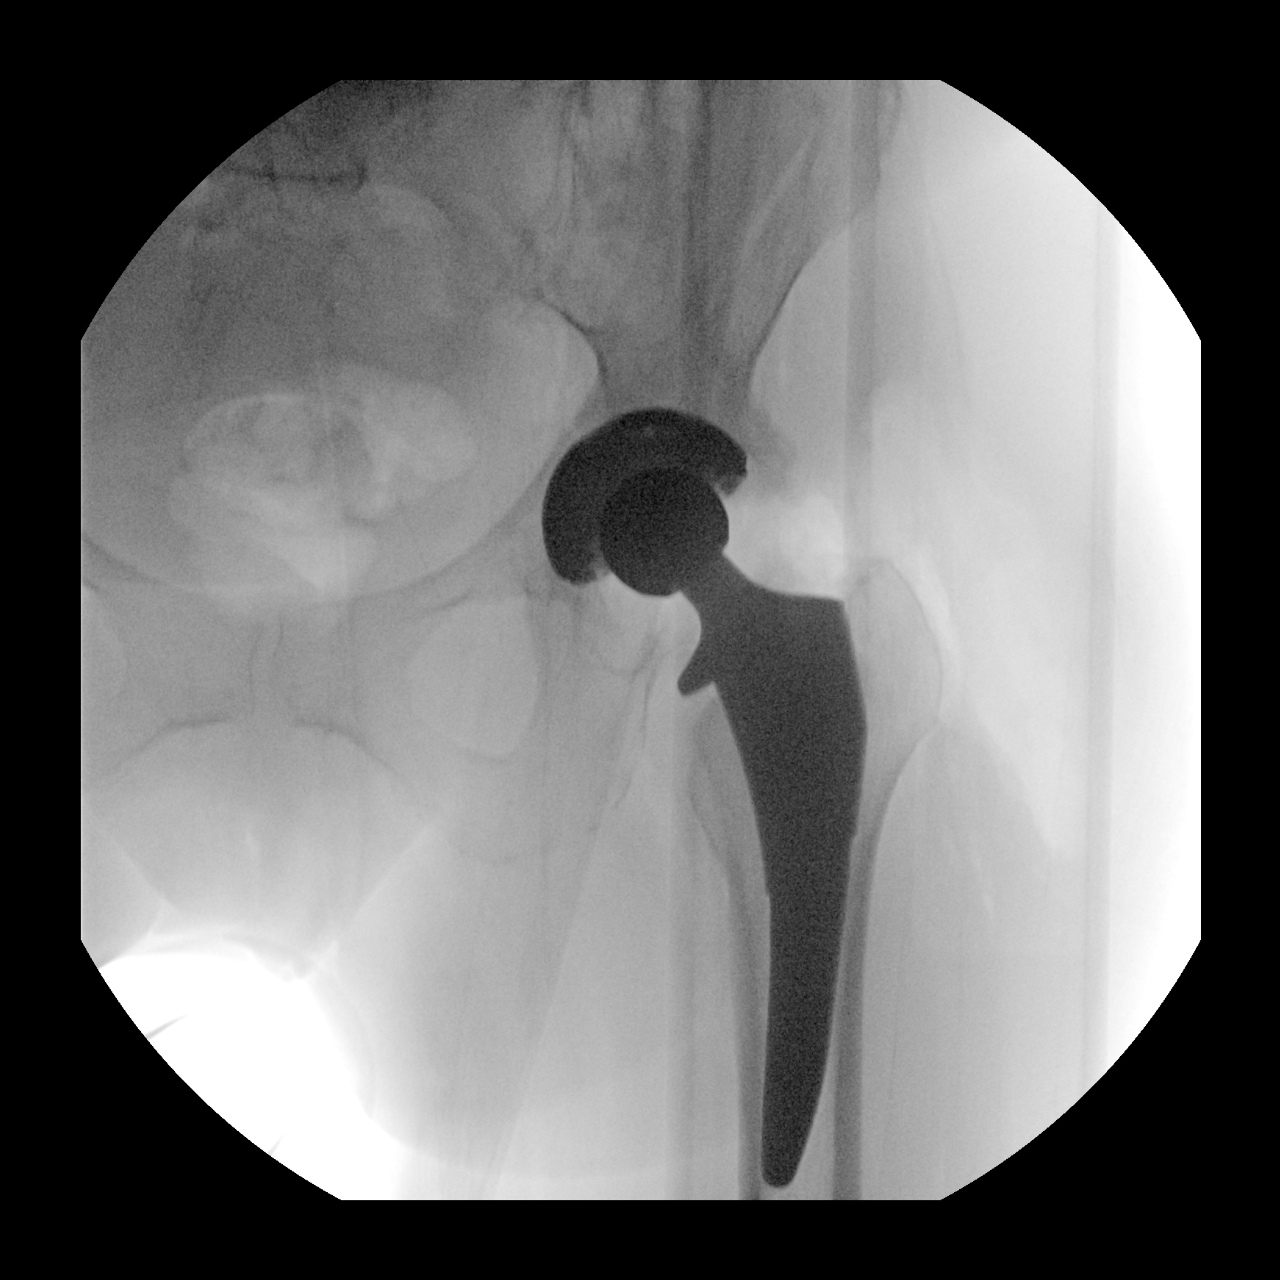
[im 2/2]
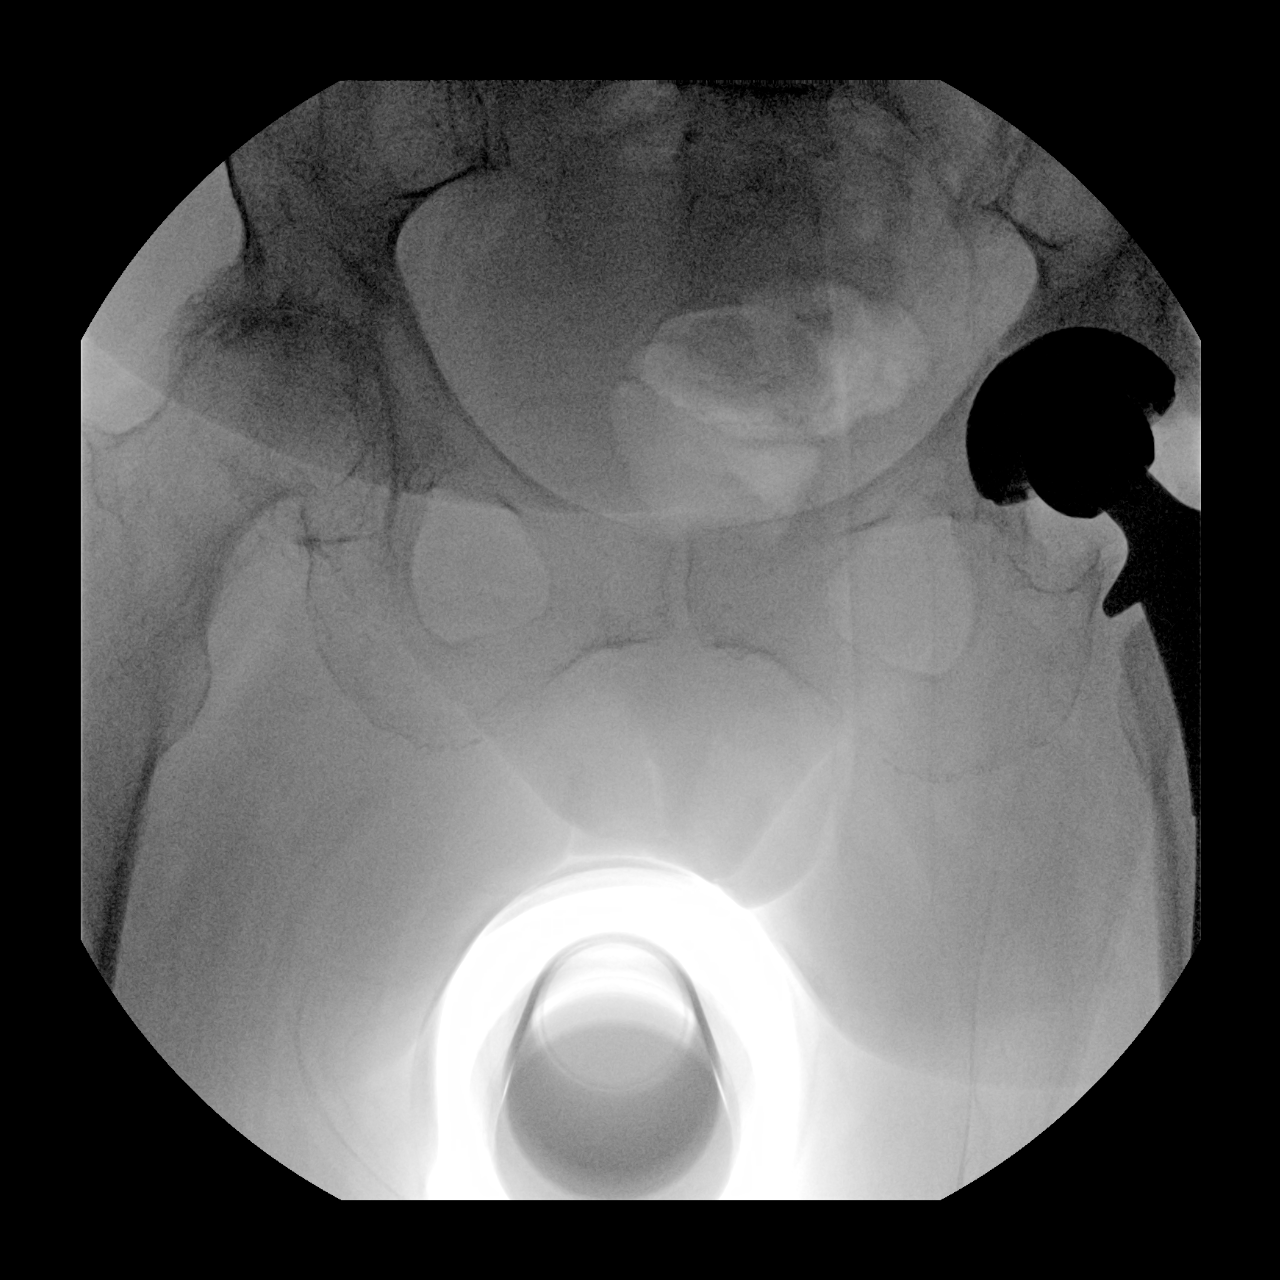

[2 of 2 positions shown; findings below may reference images not displayed]

FINDINGS: Left hip replacement in satisfactory position alignment. No fracture
or complication
IMPRESSION: Satisfactory left hip replacement.

## 2022-08-24 ENCOUNTER — Telehealth: Payer: Self-pay

## 2022-08-24 NOTE — Patient Outreach (Signed)
  Care Coordination   Initial Visit Note   08/24/2022 Name: Robin Obrien MRN: [id number] DOB: 02/10/1951  Robin Obrien is a 71 y.o. year old female who sees Jenel Lucks, PA-C for primary care. I spoke with  Grier Mitts by phone today.  What matters to the patients health and wellness today?  No Concerns Expressed/Requested Call Back    Goals Addressed   None     SDOH assessments and interventions completed:  No     Care Coordination Interventions Activated:  No  Care Coordination Interventions:  No, not indicated   Follow up plan:  Patient requested to call back.    Encounter Outcome:  Pt. Request to Call Back   [facility] [phone]

## 2023-03-14 ENCOUNTER — Encounter: Payer: Self-pay | Admitting: Internal Medicine

## 2023-04-04 ENCOUNTER — Ambulatory Visit (AMBULATORY_SURGERY_CENTER): Payer: 59 | Admitting: *Deleted

## 2023-04-04 VITALS — Ht 64.0 in | Wt 160.0 lb

## 2023-04-04 DIAGNOSIS — Z86010 Personal history of colonic polyps: Secondary | ICD-10-CM

## 2023-04-04 MED ORDER — NA SULFATE-K SULFATE-MG SULF 17.5-3.13-1.6 GM/177ML PO SOLN: 1.0000 | Freq: Once | ORAL | 0 refills | Status: AC

## 2023-04-04 NOTE — Progress Notes (Signed)

## 2023-05-03 ENCOUNTER — Encounter: Payer: Medicare Other | Admitting: Internal Medicine

## 2023-07-04 ENCOUNTER — Ambulatory Visit (AMBULATORY_SURGERY_CENTER): Payer: 59 | Admitting: *Deleted

## 2023-07-04 VITALS — Ht 64.0 in | Wt 165.0 lb

## 2023-07-04 DIAGNOSIS — Z86010 Personal history of colonic polyps: Secondary | ICD-10-CM

## 2023-07-04 MED ORDER — NA SULFATE-K SULFATE-MG SULF 17.5-3.13-1.6 GM/177ML PO SOLN: 1.0000 | Freq: Once | ORAL | 0 refills | Status: AC

## 2023-07-04 NOTE — Progress Notes (Signed)
Pt's name and DOB verified at the beginning of the pre-visit.  Pt denies any difficulty with ambulating,sitting, laying down or rolling side to side Gave both [facility] main # and MD on call # prior to instructions.  No egg or soy allergy known to patient  No issues known to pt with past sedation with any surgeries or procedures Pt denies having issues being intubated Pt has no issues moving head neck or swallowing No FH of Malignant Hyperthermia Pt is not on diet pills Pt is not on home 02  Pt is not on blood thinners  Pt denies issues with constipation  Pt is not on dialysis Pt denise any abnormal heart rhythms  Pt denies any upcoming cardiac testing Pt encouraged to use to use Singlecare or Goodrx to reduce cost  Patient's chart reviewed by Cathlyn Parsons CNRA prior to pre-visit and patient appropriate for the [facility].  Pre-visit completed and red dot placed by patient's name on their procedure day (on provider's schedule).  . Visit by phone Pt states weight is 165 lb Instructed pt why it is important to and  to call if they have any changes in health or new medications. Directed them to the # given and on instructions.   Pt states they will.  Instructions reviewed with pt and pt states understanding. Instructed to review again prior to procedure. Pt states they will.  Instructions sent by mail with coupon and by my chart

## 2023-07-25 ENCOUNTER — Encounter: Payer: Medicare Other | Admitting: Internal Medicine

## 2023-12-28 DIAGNOSIS — R310 Gross hematuria: Secondary | ICD-10-CM | POA: Diagnosis not present

## 2023-12-28 DIAGNOSIS — J00 Acute nasopharyngitis [common cold]: Secondary | ICD-10-CM | POA: Diagnosis not present

## 2023-12-28 DIAGNOSIS — N3001 Acute cystitis with hematuria: Secondary | ICD-10-CM | POA: Diagnosis not present

## 2024-03-05 DIAGNOSIS — R7303 Prediabetes: Secondary | ICD-10-CM | POA: Diagnosis not present

## 2024-03-05 DIAGNOSIS — I10 Essential (primary) hypertension: Secondary | ICD-10-CM | POA: Diagnosis not present

## 2024-03-05 DIAGNOSIS — E559 Vitamin D deficiency, unspecified: Secondary | ICD-10-CM | POA: Diagnosis not present

## 2024-03-05 DIAGNOSIS — E538 Deficiency of other specified B group vitamins: Secondary | ICD-10-CM | POA: Diagnosis not present
